# Patient Record
Sex: Female | Born: 2000 | Race: White | Hispanic: No | Marital: Single | Smoking: Never smoker
Health system: Southern US, Community
[De-identification: ages and names within clinical notes are randomized; demographics above are authoritative.]

## PROBLEM LIST (undated history)

## (undated) DIAGNOSIS — F250 Schizoaffective disorder, bipolar type: Secondary | ICD-10-CM

## (undated) DIAGNOSIS — F99 Mental disorder, not otherwise specified: Secondary | ICD-10-CM

## (undated) DIAGNOSIS — F70 Mild intellectual disabilities: Secondary | ICD-10-CM

## (undated) DIAGNOSIS — N920 Excessive and frequent menstruation with regular cycle: Secondary | ICD-10-CM

## (undated) DIAGNOSIS — Z789 Other specified health status: Secondary | ICD-10-CM

## (undated) HISTORY — DX: Mental disorder, not otherwise specified: F99

## (undated) HISTORY — PX: EYE EXAMINATION UNDER ANESTHESIA W/ RETINAL CRYOTHERAPY AND RETINAL LASER: SHX1561

## (undated) HISTORY — PX: ADENOIDECTOMY, TONSILLECTOMY AND MYRINGOTOMY WITH TUBE PLACEMENT: SHX5716

---
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
## 2020-04-16 DIAGNOSIS — G40909 Epilepsy, unspecified, not intractable, without status epilepticus: Secondary | ICD-10-CM

## 2020-04-16 DIAGNOSIS — R569 Unspecified convulsions: Secondary | ICD-10-CM

## 2020-04-16 HISTORY — DX: Epilepsy, unspecified, not intractable, without status epilepticus: G40.909

## 2020-04-16 HISTORY — DX: Unspecified convulsions: R56.9

## 2020-05-14 ENCOUNTER — Encounter (HOSPITAL_COMMUNITY): Payer: Self-pay | Admitting: Emergency Medicine

## 2020-05-14 ENCOUNTER — Emergency Department (HOSPITAL_COMMUNITY): Payer: Medicaid Other

## 2020-05-14 ENCOUNTER — Emergency Department (HOSPITAL_COMMUNITY)
Admission: EM | Admit: 2020-05-14 | Discharge: 2020-05-14 | Disposition: A | Discharge status: Home / Self Care | Payer: Medicaid Other | Attending: Emergency Medicine | Admitting: Emergency Medicine

## 2020-05-14 ENCOUNTER — Other Ambulatory Visit: Payer: Self-pay

## 2020-05-14 DIAGNOSIS — R569 Unspecified convulsions: Secondary | ICD-10-CM | POA: Insufficient documentation

## 2020-05-14 DIAGNOSIS — Z79899 Other long term (current) drug therapy: Secondary | ICD-10-CM | POA: Diagnosis not present

## 2020-05-14 DIAGNOSIS — R410 Disorientation, unspecified: Secondary | ICD-10-CM | POA: Insufficient documentation

## 2020-05-14 LAB — MAGNESIUM: Magnesium: 1.9 mg/dL (ref 1.7–2.4)

## 2020-05-14 LAB — BASIC METABOLIC PANEL
Anion gap: 11 (ref 5–15)
BUN: 17 mg/dL (ref 6–20)
CO2: 22 mmol/L (ref 22–32)
Calcium: 9 mg/dL (ref 8.9–10.3)
Chloride: 108 mmol/L (ref 98–111)
Creatinine, Ser: 0.9 mg/dL (ref 0.44–1.00)
GFR calc Af Amer: >60 mL/min (ref >60)
GFR calc non Af Amer: >60 mL/min (ref >60)
Glucose, Bld: 99 mg/dL (ref 70–99)
Potassium: 3.7 mmol/L (ref 3.5–5.1)
Sodium: 141 mmol/L (ref 135–145)

## 2020-05-14 LAB — CBC
HCT: 41.1 % (ref 36.0–46.0)
Hemoglobin: 13.8 g/dL (ref 12.0–15.0)
MCH: 29.2 pg (ref 26.0–34.0)
MCHC: 33.6 g/dL (ref 30.0–36.0)
MCV: 87.1 fL (ref 80.0–100.0)
Platelets: 217 10*3/uL (ref 150–400)
RBC: 4.72 MIL/uL (ref 3.87–5.11)
RDW: 12.7 % (ref 11.5–15.5)
WBC: 5.8 10*3/uL (ref 4.0–10.5)
nRBC: 0 % (ref 0.0–0.2)

## 2020-05-14 LAB — I-STAT BETA HCG BLOOD, ED (MC, WL, AP ONLY): I-stat hCG, quantitative: <5 m[IU]/mL (ref <5)

## 2020-05-14 MED ORDER — DIVALPROEX SODIUM 250 MG PO DR TAB
DELAYED_RELEASE_TABLET | ORAL | 0 refills | Status: DC
Start: 2020-05-14 — End: 2021-01-07

## 2020-05-14 MED ORDER — VALPROATE SODIUM 500 MG/5ML IV SOLN
750.0000 mg | Freq: Once | INTRAVENOUS | Status: AC
  Administered 2020-05-14: 750 mg via INTRAVENOUS
  Filled 2020-05-14: qty 7.5

## 2020-05-14 NOTE — ED Provider Notes (Signed)
[name] EMERGENCY DEPARTMENT Provider Note   CSN: [id number] Arrival date & time: 05/14/20  1139     History No chief complaint on file.   Ariel Clark is a 19 y.o. female.  Patient is a 19 year old female coming from home who has a past medical history of developmental delay, auditory hallucinations, schizoaffective disorder presenting to the emergency department with her father for witnessed seizure which occurred just prior to arrival.  She has no history of the same.  Father reports that she was searching for something in her room when all of a sudden she fell to her bottom and then to the fetal position.  Reports that her arm is turned inward and she began to have full body shaking, jaw clenching, urinary incontinence and foaming at the mouth.  Reports that this lasted for 1 to 2 minutes before coming to.  She was confused and combative when she came to.  She does not remember the event.  Father reports that he gives her her medication and she has not missed any doses.  She was taken off of prazosin the 21st of this month.  She receives a monthly injection for her hallucinations.  Denies any alcohol or drug use.  Denies any head trauma.  Of note, she did see a neurologist in 2016 related to her hallucinations and had a normal EEG at that time.  Patient is a poor historian due to her developmental delay.        Past Medical History:  Diagnosis Date  . Medical history non-contributory   . Premature birth    66 weeks    Patient Active Problem List   Diagnosis Date Noted  . MDD (major depressive disorder), recurrent, severe, with psychosis (HCC) 03/22/2018  . Auditory hallucinations 05/28/2015  . Visual hallucinations 05/28/2015  . Mild intellectual disability 05/28/2015    Past Surgical History:  Procedure Laterality Date  . EYE EXAMINATION UNDER ANESTHESIA W/ RETINAL CRYOTHERAPY AND RETINAL LASER       OB History   No obstetric history on file.      Family History  Problem Relation Age of Onset  . Cancer Maternal Grandmother 40  . Other Paternal Grandmother 49       Murdered    Social History   Tobacco Use  . Smoking status: Never Smoker  . Smokeless tobacco: Never Used  Vaping Use  . Vaping Use: Never used  Substance Use Topics  . Alcohol use: Never    Alcohol/week: 0.0 standard drinks  . Drug use: Never    Home Medications Prior to Admission medications   Medication Sig Start Date End Date Taking? Authorizing Provider  amantadine (SYMMETREL) 100 MG capsule Take 150 mg by mouth 2 (two) times daily.   Yes [provider]  QUEtiapine (SEROQUEL) 50 MG tablet Take 50 mg by mouth at bedtime.   Yes [provider]  divalproex (DEPAKOTE) 250 MG DR tablet Take 1 tablet (250 mg total) by mouth in the morning AND 2 tablets (500 mg total) at bedtime. 05/14/20 06/13/20  Arlyn Dunning, PA-C  FLUoxetine (PROZAC) 20 MG capsule Take 1 capsule (20 mg total) by mouth daily. 03/29/18   Leata Mouse, MD  prazosin (MINIPRESS) 1 MG capsule Take 1 capsule (1 mg total) by mouth at bedtime. 03/28/18   Leata Mouse, MD  ziprasidone (GEODON) 40 MG capsule Take 1 capsule (40 mg total) by mouth 2 (two) times daily with a meal. 03/28/18   Jonnalagadda,  Sharyne Peach, MD    Allergies    Patient has no known allergies.  Review of Systems   Review of Systems  Constitutional: Negative for fever.  HENT: Negative for rhinorrhea.   Eyes: Negative for visual disturbance.  Respiratory: Negative for shortness of breath.   Cardiovascular: Negative for chest pain.  Gastrointestinal: Negative for abdominal pain, nausea and vomiting.  Genitourinary: Negative for dysuria.  Musculoskeletal: Negative for arthralgias and neck pain.  Skin: Negative for rash and wound.  Neurological: Negative for dizziness.  Psychiatric/Behavioral: Negative for agitation. The patient is not nervous/anxious.   All other systems reviewed  and are negative.   Physical Exam Updated Vital Signs BP 130/90 (BP Location: Right Arm)   Pulse 83   Temp 97.6 F (36.4 C) (Oral)   Resp 18   Ht 4' (1.219 m)   Wt 47.7 kg   SpO2 98%   BMI 32.09 kg/m   Physical Exam Vitals and nursing note reviewed.  Constitutional:      Appearance: Normal appearance.  HENT:     Head: Normocephalic and atraumatic.     Nose: Nose normal.     Mouth/Throat:     Mouth: Mucous membranes are moist.  Eyes:     Conjunctiva/sclera: Conjunctivae normal.     Pupils: Pupils are equal, round, and reactive to light.  Pulmonary:     Effort: Pulmonary effort is normal.     Breath sounds: Normal breath sounds.  Abdominal:     General: Abdomen is flat.     Palpations: Abdomen is soft.  Skin:    General: Skin is dry.  Neurological:     General: No focal deficit present.     Mental Status: She is alert.     Sensory: No sensory deficit.     Gait: Gait normal.  Psychiatric:     Comments: reserved     ED Results / Procedures / Treatments   Labs (all labs ordered are listed, but only abnormal results are displayed) Labs Reviewed  BASIC METABOLIC PANEL  CBC  MAGNESIUM  I-STAT BETA HCG BLOOD, ED ([facility], [facility], [facility] ONLY)  CBG MONITORING, ED    EKG EKG Interpretation  Date/Time:  Tuesday May 14 2020 11:55:29 EDT Ventricular Rate:  93 PR Interval:  126 QRS Duration: 84 QT Interval:  332 QTC Calculation: 412 R Axis:   75 Text Interpretation: Normal sinus rhythm Nonspecific T wave abnormality Abnormal ECG No significant change since last tracing Confirmed by Richardean Canal [phone]) on 05/14/2020 6:31:20 PM   Radiology CT Head Wo Contrast  Result Date: 05/14/2020 CLINICAL DATA:  Seizure EXAM: CT HEAD WITHOUT CONTRAST TECHNIQUE: Contiguous axial images were obtained from the base of the skull through the vertex without intravenous contrast. COMPARISON:  None. FINDINGS: Brain: Negative for intracranial hemorrhage. Abnormal white matter hypodensity  bilaterally. The ventricles are nonenlarged. Vascular: No hyperdense vessels.  No unexpected calcification. Skull: Normal. Negative for fracture or focal lesion. Sinuses/Orbits: No acute finding. Other: None IMPRESSION: 1. Negative for intracranial hemorrhage. 2. Abnormal white matter hypodensity bilaterally/white matter disease, further evaluation with MRI is recommended. Electronically Signed   By: Jasmine Pang M.D.   On: 05/14/2020 19:35    Procedures Procedures (including critical care time)  Medications Ordered in ED Medications  valproate (DEPACON) 750 mg in dextrose 5 % 50 mL IVPB (750 mg Intravenous New Bag/Given 05/14/20 2151)    ED Course  I have reviewed the triage vital signs and the nursing notes.  Pertinent labs &  imaging results that were available during my care of the patient were reviewed by me and considered in my medical decision making (see chart for details).  Clinical Course as of May 14 2212  Tue May 14, 2020  28[birth year] 19 year old female with intellectual and psychological disabilities presents to the emergency department for apparent new onset seizure.  She is at her baseline.  Her father who is her power of attorney is at bedside who provides most of the history due to patient's intellectual disability.    [KM]  1955 I spoke with Dr. Wilford Corner from neurology regarding this patient. He reviewed the ct and lab results. He recommends Depakote 750 IV loading now now, then start 250 am and 500 nightly. F/u neuro and pmd this week., Discussed all results with patient and father at bedside   [KM]    Clinical Course User Index [KM] Jeral Pinch   MDM Rules/Calculators/A&P                          Based on review of vitals, medical screening exam, lab work and/or imaging, there does not appear to be an acute, emergent etiology for the patient's symptoms. Counseled pt on good return precautions and encouraged both PCP and ED follow-up as needed.  Prior to discharge, I  also discussed incidental imaging findings with patient in detail and advised appropriate, recommended follow-up in detail.  Clinical Impression: 1. Seizure [facility])     Disposition: Discharge  Prior to providing a prescription for a controlled substance, I independently reviewed the patient's recent prescription history on the [place] Controlled Substance Reporting System. The patient had no recent or regular prescriptions and was deemed appropriate for a brief, less than 3 day prescription of narcotic for acute analgesia.  This note was prepared with assistance of Conservation officer, historic buildings. Occasional wrong-word or sound-a-like substitutions may have occurred due to the inherent limitations of voice recognition software.  Final Clinical Impression(s) / ED Diagnoses Final diagnoses:  Seizure [facility])    Rx / DC Orders ED Discharge Orders         Ordered    divalproex (DEPAKOTE) 250 MG DR tablet     Discontinue  Reprint     05/14/20 2005           Jeral Pinch 05/14/20 2213    Charlynne Pander, MD 05/14/20 [phone]

## 2020-05-14 NOTE — ED Triage Notes (Signed)
Pt in after possible grand-mal seizure x 1 min, witnessed by dad, who is POA. No hx of seizures, and baseline a&ox2-3, nonverbal at times. Dad reports she had a psychotic/manic episode this am and fell on her bottom. Never had any head trauma, but had the seizure right after this. VSS as charted, back to baseline per EMS

## 2020-05-14 NOTE — ED Notes (Signed)
Taken to CT.

## 2020-05-14 NOTE — Discharge Instructions (Addendum)
You are seen today for what appears to be a new seizure.  Your lab work was normal.  Your CT scan showed some chronic white matter changes.  We discussed all of these findings with our neurologist Dr. Jerrell Belfast.  Per his recommendations.  We have given you an IV dose of Depakote and will start you on oral Depakote at home.  It is important that you call the neurology office tomorrow and be scheduled this week for a follow-up appointment and review have further testing such as an MRI and an EEG.  You also need to schedule follow-up with your primary medical doctor to check the Depakote levels in your blood.  In the meantime, please follow seizure precautions which include not doing anything that may be dangerous if he were to have another seizure.  This includes no swimming, no driving, no operating heavy machinery. Thank you for allowing me to care for you today. Please return to the emergency department if you have new or worsening symptoms. Take your medications as instructed.

## 2020-06-27 ENCOUNTER — Telehealth: Payer: Self-pay

## 2020-06-27 NOTE — Telephone Encounter (Signed)
Patient is scheduled to see Dr. Lucia Gaskins as a new patient on 07/10/20 for seizures. Patient was seen in the ED in June and they put her on Depakote 250mg  in the AM and 500mg  in the PM. Dr. at [facility] in [place] was calling to discuss this patient with Dr. [facility]. She checked her depakote level on 8/10 and it is 123.0. Patient has not had any seizure activity since June. She just isn't sure if she needed to be seen sooner or what Dr. 10/10 would recommend.  Dr. July can be reached at [phone]

## 2020-06-28 NOTE — Telephone Encounter (Signed)
I called back, Dr. Lucretia Roers is not in the office until Tuesday. If we have an earlier spot we can transfer her to this so I am adding the referral team on this message. I'm not too worried about a slightly elevated Depakote level if she is doing well, I don;t know if it was a trough level either so we will see her in the office. Also, if one of the seizure preferred providers(see information sheet from Dr. Marjory Lies) have an opening, she should  preferentially be seen by them thanks

## 2020-06-28 NOTE — Telephone Encounter (Signed)
I called back, Dr. Lucretia Roers is not in the office until Tuesday. If we have an earlier spot we can transfer her to this so I am adding the referral team on this message. I'm not too worried about a slightly elevated Depakote level if she is doing well, I don;t know if it was a trough level either so we will see her in the office thanks.

## 2020-07-05 ENCOUNTER — Ambulatory Visit: Payer: Medicaid Other | Admitting: Neurology

## 2020-07-05 ENCOUNTER — Encounter: Payer: Self-pay | Admitting: Neurology

## 2020-07-05 DIAGNOSIS — G40909 Epilepsy, unspecified, not intractable, without status epilepticus: Secondary | ICD-10-CM | POA: Diagnosis not present

## 2020-07-05 HISTORY — DX: Epilepsy, unspecified, not intractable, without status epilepticus: G40.909

## 2020-07-05 MED ORDER — ALPRAZOLAM 0.5 MG PO TABS: ORAL_TABLET | ORAL | 0 refills | Status: DC

## 2020-07-05 NOTE — Progress Notes (Signed)
Reason for visit: Seizure  Referring physician: Rainelle  Ariel Clark is a 19 y.o. female  History of present illness:  Ariel Clark is a 19 year old right-handed white female with a history of a premature birth at 35 weeks with a developmental delay associated with this.  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  As time has gone on, the patient has developed problems with hallucinations that began when she was in seventh grade, she was seen by Dr. Sharene Clark in the past for this.  An EEG study at that time was normal.  The patient apparently did have a CT scan of the brain when she was an infant, but there have been no brain studies since that time.  The results of the initial studies are not available to me.  The patient had a witnessed seizure event on 14 May 2020, and she went to the emergency room.  The patient had an episode of falling to the ground in the fetal position then having generalized jerking of the extremities, foaming at the mouth, and incontinence.  The patient took several minutes to start recovering her mental status.  The patient was brought to the hospital and underwent a CT scan of the brain that shows bifrontal low-density lesions and a low-density area in the right posterior frontal region.  There are no comparison studies.  The study was done without contrast.  The patient has returned to her usual baseline.  She was placed on Depakote around that time taking 250 mg in the morning and 500 mg in the evening, she has had a recent blood level of 123 as a random blood draw.  The patient seems to tolerate the medication fairly well.  About a year before she had a seizure, she has had some tremors of both hands that have continued on the Depakote.  She has not had any significant change in sensation or strength, she does report some numbness of the left hand.  She has some issues with gait instability, she did have a recent fall down some  stairs with minor injuries.  The patient denies problems controlling the bowels or the bladder, she will be undergoing a colonoscopy in the near future for blood in the stools.  The patient not had any recurring seizures on Depakote.  She comes to this office for further evaluation.  Past Medical History:  Diagnosis Date  . Medical history non-contributory   . Mental disorder   . Premature birth    26 weeks  . Seizures (HCC) 04/2020    Past Surgical History:  Procedure Laterality Date  . ADENOIDECTOMY, TONSILLECTOMY AND MYRINGOTOMY WITH TUBE PLACEMENT    . EYE EXAMINATION UNDER ANESTHESIA W/ RETINAL CRYOTHERAPY AND RETINAL LASER      Family History  Problem Relation Age of Onset  . Cancer Maternal Grandmother 103  . Other Paternal Grandmother 30       Murdered    Social history:  reports that she has never smoked. She has never used smokeless tobacco. She reports that she does not drink alcohol and does not use drugs.  Medications:  Prior to Admission medications   Medication Sig Start Date End Date Taking? Authorizing Provider  amantadine (SYMMETREL) 100 MG capsule Take 150 mg by mouth 2 (two) times daily.   Yes [provider]  ARISTADA 662 MG/2.4ML prefilled syringe SMARTSIG:1 IM Once a Month 06/26/20  Yes [provider]  divalproex (DEPAKOTE)  250 MG DR tablet Take 1 tablet (250 mg total) by mouth in the morning AND 2 tablets (500 mg total) at bedtime. 05/14/20 07/05/20 Yes Ronnie Doss A, PA-C  QUEtiapine (SEROQUEL) 50 MG tablet Take 50 mg by mouth at bedtime.   Yes [provider]  FLUoxetine (PROZAC) 20 MG capsule Take 1 capsule (20 mg total) by mouth daily. Patient not taking: Reported on 07/05/2020 03/29/18   Leata Mouse, MD  prazosin (MINIPRESS) 1 MG capsule Take 1 capsule (1 mg total) by mouth at bedtime. Patient not taking: Reported on 07/05/2020 03/28/18   Leata Mouse, MD     No Known Allergies  ROS:  Out of a  complete 14 system review of symptoms, the patient complains only of the following symptoms, and all other reviewed systems are negative.  Seizure Walking difficulty  Blood pressure 121/76, pulse (!) 101, height 5\' 1"  (1.549 m), weight 104 lb 8 oz (47.4 kg).  Physical Exam  General: The patient is alert and cooperative at the time of the examination.  Eyes: Pupils are equal, round, and reactive to light. Discs are flat bilaterally.  Neck: The neck is supple, no carotid bruits are noted.  Respiratory: The respiratory examination is clear.  Cardiovascular: The cardiovascular examination reveals a regular rate and rhythm, no obvious murmurs or rubs are noted.  Skin: Extremities are without significant edema.  Neurologic Exam  Mental status: The patient is alert and oriented x 3 at the time of the examination. The patient has apparent normal recent and remote memory, with an apparently normal attention span and concentration ability.  Cranial nerves: Facial symmetry is present. There is good sensation of the face to pinprick and soft touch bilaterally. The strength of the facial muscles and the muscles to head turning and shoulder shrug are normal bilaterally. Speech is well enunciated, no aphasia or dysarthria is noted. Extraocular movements are full. Visual fields are full. The tongue is midline, and the patient has symmetric elevation of the soft palate. No obvious hearing deficits are noted.  Motor: The motor testing reveals 5 over 5 strength of all 4 extremities. Good symmetric motor tone is noted throughout.  Sensory: Sensory testing is intact to pinprick, soft touch, vibration sensation, and position sense on all 4 extremities, with exception of some decreased pinprick and vibration sensation on the left hand. No evidence of extinction is noted.  Coordination: Cerebellar testing reveals good finger-nose-finger and heel-to-shin bilaterally.  Gait and station: Gait is slightly  wide-based, the patient can walk independently.  Tandem gait is minimally unsteady.  Romberg is negative.  Reflexes: Deep tendon reflexes are symmetric, but are somewhat depressed bilaterally. Toes are downgoing bilaterally.   CT head 05/14/20:  IMPRESSION: 1. Negative for intracranial hemorrhage. 2. Abnormal white matter hypodensity bilaterally/white matter disease, further evaluation with MRI is recommended.  * CT scan images were reviewed online. I agree with the written report.    Assessment/Plan:  1.  New onset seizure  2.  Abnormal CT scan brain  3.  Premature birth, 26 weeks  4.  Developmental delay  5.  Schizoaffective disorder, auditory hallucinations, bipolar disorder  The patient is being followed through psychiatry, they have prescribed the Depakote which was started in the emergency room.  The patient has an abnormal CT scan of the brain, MRI of the brain will be attempted with and without contrast.  The patient will have an EEG study.  She is okay from a neurologic standpoint to have colonoscopy.  She  will follow-up here in 6 months.  The patient does not operate a motor vehicle.  Marlan Palau MD 07/05/2020 10:35 AM  [facility] [address] [place], [place] [address]  Phone [phone] Fax [phone]

## 2020-07-08 ENCOUNTER — Telehealth: Payer: Self-pay | Admitting: Neurology

## 2020-07-08 NOTE — Telephone Encounter (Signed)
mcd ffs per [place] track order sent to GI. No auth they will reach out to the patient to schedule.

## 2020-07-10 ENCOUNTER — Ambulatory Visit: Payer: Self-pay | Admitting: Neurology

## 2020-07-14 ENCOUNTER — Ambulatory Visit
Admission: RE | Admit: 2020-07-14 | Discharge: 2020-07-14 | Disposition: A | Discharge status: Home / Self Care | Payer: Medicaid Other | Source: Ambulatory Visit | Attending: Neurology | Admitting: Neurology

## 2020-07-14 DIAGNOSIS — G40909 Epilepsy, unspecified, not intractable, without status epilepticus: Secondary | ICD-10-CM

## 2020-07-14 MED ORDER — GADOBENATE DIMEGLUMINE 529 MG/ML IV SOLN
9.0000 mL | Freq: Once | INTRAVENOUS | Status: AC | PRN
  Administered 2020-07-14: 9 mL via INTRAVENOUS

## 2020-07-15 ENCOUNTER — Telehealth: Payer: Self-pay | Admitting: Neurology

## 2020-07-15 NOTE — Telephone Encounter (Signed)
I called the father.  MRI of the brain shows white matter changes that likely related to hypoxic/ischemic changes around the time of birth.  This could be the source of seizures.  EEG is pending.    MRI brain 07/14/20:  IMPRESSION: This MRI of the brain with and without contrast shows the following: 1.   Extensive T2/FLAIR hyperintense foci in the hemispheres with confluencies in the deep white matter of the frontal and parietal lobes.  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 2.   There are no acute findings and there is a normal enhancement pattern.

## 2020-07-24 HISTORY — PX: COLONOSCOPY WITH ESOPHAGOGASTRODUODENOSCOPY (EGD): SHX5779

## 2020-07-29 ENCOUNTER — Other Ambulatory Visit: Payer: Self-pay

## 2020-07-29 ENCOUNTER — Ambulatory Visit: Payer: Medicaid Other | Admitting: Neurology

## 2020-07-29 ENCOUNTER — Telehealth: Payer: Self-pay | Admitting: Neurology

## 2020-07-29 DIAGNOSIS — G40909 Epilepsy, unspecified, not intractable, without status epilepticus: Secondary | ICD-10-CM

## 2020-07-29 NOTE — Telephone Encounter (Signed)
The EEG study was normal.  I called the father, discussed the results with him.

## 2020-07-29 NOTE — Procedures (Signed)
    History:  Ariel Clark is a 19 year old patient with a premature birth and developmental delay.  The patient is being evaluated for a witnessed seizure event that occurred on 14 May 2020.  The patient fell to the ground and then had a generalized jerking event, taken several minutes to recover her mental status after this episode.  CT shows evidence of bifrontal low-density lesions.  This is a routine EEG.  No skull defects are noted.  Medications include Symmetrel, Aristada, Depakote, Seroquel, and Minipress.  EEG classification: Normal awake  Description of the recording: The background rhythms of this recording consists of a fairly well modulated medium amplitude alpha rhythm of 11 Hz that is reactive to eye opening and closure. As the record progresses, the patient appears to remain in the waking state throughout the recording. Photic stimulation was performed, resulting in a bilateral and symmetric photic driving response. Hyperventilation was also performed, resulting in a minimal buildup of the background rhythm activities without significant slowing seen. At no time during the recording does there appear to be evidence of spike or spike wave discharges or evidence of focal slowing. EKG monitor shows no evidence of cardiac rhythm abnormalities with a heart rate of 72.  Impression: This is a normal EEG recording in the waking state. No evidence of ictal or interictal discharges are seen.

## 2020-11-16 HISTORY — PX: WISDOM TOOTH EXTRACTION: SHX21

## 2020-11-18 ENCOUNTER — Telehealth: Payer: Self-pay | Admitting: Neurology

## 2020-11-18 NOTE — Telephone Encounter (Signed)
Father asked pt be seen earlier as a result of seizure over the weekend, this is Ariel Clark

## 2020-11-19 NOTE — Progress Notes (Signed)
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  Had a seizure in June 2021.  MRI of the brain showed white matter changes likely related to hypoxic/ischemic changes around the time of birth.  EEG was normal.  She is on Depakote prescribed through psychiatry.  [place], lasted 3 minutes, she was incontinent of urine.  Was compliant with medications.  CT head showed no acute intercranial hemorrhage.  In the ER, labs showed WBC 12.8, normal liver function.  Since on Depakote, has noted tremor of both hands, hair thinning.  Mental health has been stable, also on amantadine 9, Abilify once a month injection, and Seroquel.  She has imaginary friends she talks with, are pleasant. Sees [facility]. Stays at home with her dad during the day.  Presents today for evaluation accompanied by her mother.  HISTORY 07/05/2020 Dr. Anne Hahn: Ariel Clark is a 20 year old right-handed white female with a history of a premature birth at 28 weeks with a developmental delay associated with this.  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  As time has gone on, the patient has developed problems with hallucinations that began when she was in seventh grade, she was seen by Dr. Sharene Skeans in the past for this.  An EEG study at that time was normal.  The patient apparently did have a CT scan of the brain when she was an infant, but there have been no brain studies since that time.  The results of the initial studies are not available to me.  The patient had a witnessed seizure event on 14 May 2020, and she went to the emergency room.  The patient had an episode of falling to the ground in the fetal position then having generalized  jerking of the extremities, foaming at the mouth, and incontinence.  The patient took several minutes to start recovering her mental status.  The patient was brought to the hospital and underwent a CT scan of the brain that shows bifrontal low-density lesions and a low-density area in the right posterior frontal region.  There are no comparison studies.  The study was done without contrast.  The patient has returned to her usual baseline.  She was placed on Depakote around that time taking 250 mg in the morning and 500 mg in the evening, she has had a recent blood level of 123 as a random blood draw.  The patient seems to tolerate the medication fairly well.  About a year before she had a seizure, she has had some tremors of both hands that have continued on the Depakote.  She has not had any significant change in sensation or strength, she does report some numbness of the left hand.  She has some issues with gait instability, she did have a recent fall down some stairs with minor injuries.  The patient denies problems controlling the bowels or the bladder, she will be undergoing a colonoscopy in the near future for blood in the stools.  The patient not had any recurring seizures on Depakote.  She comes to this office for further evaluation.  REVIEW OF SYSTEMS: Out of a complete 14 system review of symptoms, the patient complains only of the following symptoms, and all  other reviewed systems are negative.  Seizure  ALLERGIES: No Known Allergies  HOME MEDICATIONS: Outpatient Medications Prior to Visit  Medication Sig Dispense Refill  . ALPRAZolam (XANAX) 0.5 MG tablet Take 2 tablets approximately 45 minutes prior to the MRI study, take a third tablet if needed. 3 tablet 0  . amantadine (SYMMETREL) 100 MG capsule Take 150 mg by mouth 2 (two) times daily.    Ariel Clark 662 MG/2.4ML prefilled syringe SMARTSIG:1 IM Once a Month    . divalproex (DEPAKOTE) 250 MG DR tablet Take 1 tablet (250 mg total) by  mouth in the morning AND 2 tablets (500 mg total) at bedtime. 90 tablet 0  . QUEtiapine (SEROQUEL) 50 MG tablet Take 50 mg by mouth at bedtime.    Marland Kitchen FLUoxetine (PROZAC) 20 MG capsule Take 1 capsule (20 mg total) by mouth daily. (Patient not taking: Reported on 11/20/2020) 30 capsule 0  . prazosin (MINIPRESS) 1 MG capsule Take 1 capsule (1 mg total) by mouth at bedtime. (Patient not taking: Reported on 11/20/2020) 30 capsule 0   No facility-administered medications prior to visit.    PAST MEDICAL HISTORY: Past Medical History:  Diagnosis Date  . Medical history non-contributory   . Mental disorder   . Premature birth    26 weeks  . Seizure disorder (HCC) 07/05/2020  . Seizures (HCC) 04/2020    PAST SURGICAL HISTORY: Past Surgical History:  Procedure Laterality Date  . ADENOIDECTOMY, TONSILLECTOMY AND MYRINGOTOMY WITH TUBE PLACEMENT    . EYE EXAMINATION UNDER ANESTHESIA W/ RETINAL CRYOTHERAPY AND RETINAL LASER      FAMILY HISTORY: Family History  Problem Relation Age of Onset  . Cancer Maternal Grandmother 53  . Other Paternal Grandmother 80       Murdered    SOCIAL HISTORY: Social History   Socioeconomic History  . Marital status: Single    Spouse name: Not on file  . Number of children: 0  . Years of education: Not on file  . Highest education level: Not on file  Occupational History    Comment: disability  Tobacco Use  . Smoking status: Never Smoker  . Smokeless tobacco: Never Used  Vaping Use  . Vaping Use: Never used  Substance and Sexual Activity  . Alcohol use: Never    Alcohol/week: 0.0 standard drinks  . Drug use: Never  . Sexual activity: Never  Other Topics Concern  . Not on file  Social History Narrative   R handed   Lives with parents and sisters   1-2 cups of Caffeine daily     Social Determinants of Health   Financial Resource Strain: Not on file  Food Insecurity: Not on file  Transportation Needs: Not on file  Physical Activity: Not on file   Stress: Not on file  Social Connections: Not on file  Intimate Partner Violence: Not on file   PHYSICAL EXAM  Vitals:   11/20/20 0857  BP: 138/84  Pulse: (!) 102  Weight: 105 lb (47.6 kg)  Height: 5' (1.524 m)   Body mass index is 20.51 kg/m.  Generalized: Well developed, in no acute distress, developmental delay, mostly on her phone during visit Neurological examination  Mentation: Alert, most history is provided by her mother, follows exam commands, speech is clear Cranial nerve II-XII: Pupils were equal round reactive to light. Extraocular movements were full, visual field were full on confrontational test. Facial sensation and strength were normal. Head turning and shoulder shrug  were normal and symmetric. Motor:  The motor testing reveals 5 over 5 strength of all 4 extremities. Good symmetric motor tone is noted throughout.  Sensory: Sensory testing is intact to soft touch on all 4 extremities. No evidence of extinction is noted.  Coordination: Cerebellar testing reveals good finger-nose-finger and heel-to-shin bilaterally. No prominent tremor was seen Gait and station: Gait is normal. Tandem gait is normal. Romberg is negative. No drift is seen.  Reflexes: Deep tendon reflexes are symmetric but depressed throughout  DIAGNOSTIC DATA (LABS, IMAGING, TESTING) - I reviewed patient records, labs, notes, testing and imaging myself where available.  Lab Results  Component Value Date   WBC 5.8 05/14/2020   HGB 13.8 05/14/2020   HCT 41.1 05/14/2020   MCV 87.1 05/14/2020   PLT 217 05/14/2020      Component Value Date/Time   NA 141 05/14/2020 1206   K 3.7 05/14/2020 1206   CL 108 05/14/2020 1206   CO2 22 05/14/2020 1206   GLUCOSE 99 05/14/2020 1206   BUN 17 05/14/2020 1206   CREATININE 0.90 05/14/2020 1206   CALCIUM 9.0 05/14/2020 1206   PROT 7.7 03/24/2018 0630   ALBUMIN 4.5 03/24/2018 0630   AST 17 03/24/2018 0630   ALT 17 03/24/2018 0630   ALKPHOS 103 03/24/2018  0630   BILITOT 0.5 03/24/2018 0630   GFRNONAA >60 05/14/2020 1206   GFRAA >60 05/14/2020 1206   Lab Results  Component Value Date   CHOL 123 03/24/2018   HDL 50 03/24/2018   LDLCALC 62 03/24/2018   TRIG 53 03/24/2018   CHOLHDL 2.5 03/24/2018   Lab Results  Component Value Date   HGBA1C 4.9 03/24/2018   No results found for: VITAMINB12 Lab Results  Component Value Date   TSH 2.527 03/24/2018   ASSESSMENT AND PLAN 20 y.o. year old female  has a past medical history of Medical history non-contributory, Mental disorder, Premature birth, Seizure disorder ([place]) (07/05/2020), and Seizures ([place]) (04/2020). here with:  1.  New onset seizure (June 2021, 11/15/2020) 2.  Premature birth, 26 weeks 3.  Developmental delay 4.  Schizoaffective disorder, auditory hallucinations, bipolar disorder  -Breakthrough seizure on Depakote 250/500 mg daily, reported adverse effect of tremor, hair thinning, prescribed through psychiatry for mood disorder  -Will switch to Lamictal, wean off Depakote  Lamotrigine Titration Instructions with 25 mg tablets to achieve 100 mg twice daily  Start taking 1 tablet (25 mg) twice daily for 2 weeks Then take 2 tablets (50 mg) twice daily for 2 weeks  Then take 3 tablets (75 mg) twice daily for 2 weeks  Call our office upon completion, and I will send in a new prescription for 100 mg tablet twice daily  Once on Lamictal 100 mg twice daily for 1 week, will back off the Depakote by 250 mg every 1 week.  -MRI of the brain showed white matter changes likely related to hypoxic/ischemic changes around the time of birth -EEG was normal -Follow-up in 3 months or sooner if needed, call for seizure activity   I spent 30 minutes of face-to-face and non-face-to-face time with patient.  This included previsit chart review, lab review, study review, order entry, electronic health record documentation, patient education.  Butler Denmark, AGNP-C, DNP 11/20/2020, 9:31 AM [facility] [address] [place], [place] [address] [phone]

## 2020-11-20 ENCOUNTER — Encounter: Payer: Self-pay | Admitting: Neurology

## 2020-11-20 ENCOUNTER — Ambulatory Visit: Payer: Medicaid Other | Admitting: Neurology

## 2020-11-20 VITALS — BP 138/84 | HR 102 | Ht 60.0 in | Wt 105.0 lb

## 2020-11-20 DIAGNOSIS — R440 Auditory hallucinations: Secondary | ICD-10-CM

## 2020-11-20 DIAGNOSIS — G40909 Epilepsy, unspecified, not intractable, without status epilepticus: Secondary | ICD-10-CM | POA: Diagnosis not present

## 2020-11-20 MED ORDER — LAMOTRIGINE 25 MG PO TABS
ORAL_TABLET | ORAL | 0 refills | Status: DC
Start: 2020-11-20 — End: 2020-12-17

## 2020-11-20 NOTE — Progress Notes (Signed)
Lamictal RX 25mg  faxed to [facility]

## 2020-11-20 NOTE — Progress Notes (Signed)
I have read the note, and I agree with the clinical assessment and plan.  Loraine Freid K Kali Ambler   

## 2020-11-20 NOTE — Progress Notes (Addendum)
[facility] [phone], out to lunch 1-2p. Fax confirmation received , faxed notes to [facility]. [phone].

## 2020-11-20 NOTE — Patient Instructions (Signed)
Lamotrigine Titration Instructions with 25 mg tablets to achieve 100 mg twice daily  Start taking 1 tablet (25 mg) twice daily for 2 weeks Then take 2 tablets (50 mg) twice daily for 2 weeks  Then take 3 tablets (75 mg) twice daily for 2 weeks  Call our office upon completion, and I will send in a new prescription for 100 mg tablet twice daily  Once on Lamictal 100 mg twice daily for 1 week, will back off the Depakote by 250 mg every 1 week.  Lamotrigine tablets What is this medicine? LAMOTRIGINE (la MOE Patrecia Pace) is used to control seizures in adults and children with epilepsy and Lennox-Gastaut syndrome. It is also used in adults to treat bipolar disorder. This medicine may be used for other purposes; ask your health care provider or pharmacist if you have questions. COMMON BRAND NAME(S): Lamictal, Subvenite What should I tell my health care provider before I take this medicine? They need to know if you have any of these conditions:  aseptic meningitis during prior use of lamotrigine  depression  folate deficiency  kidney disease  liver disease  suicidal thoughts, plans, or attempt; a previous suicide attempt by you or a family member  an unusual or allergic reaction to lamotrigine or other seizure medications, other medicines, foods, dyes, or preservatives  pregnant or trying to get pregnant  breast-feeding How should I use this medicine? Take this medicine by mouth with a glass of water. Follow the directions on the prescription label. Do not chew these tablets. If this medicine upsets your stomach, take it with food or milk. Take your doses at regular intervals. Do not take your medicine more often than directed. A special MedGuide will be given to you by the pharmacist with each new prescription and refill. Be sure to read this information carefully each time. Talk to your pediatrician regarding the use of this medicine in children. While this drug may be prescribed for  children as young as 2 years for selected conditions, precautions do apply. Overdosage: If you think you have taken too much of this medicine contact a poison control center or emergency room at once. NOTE: This medicine is only for you. Do not share this medicine with others. What if I miss a dose? If you miss a dose, take it as soon as you can. If it is almost time for your next dose, take only that dose. Do not take double or extra doses. What may interact with this medicine?  atazanavir  carbamazepine  female hormones, including contraceptive or birth control pills  lopinavir  methotrexate  phenobarbital  phenytoin  primidone  pyrimethamine  rifampin  ritonavir  trimethoprim  valproic acid This list may not describe all possible interactions. Give your health care provider a list of all the medicines, herbs, non-prescription drugs, or dietary supplements you use. Also tell them if you smoke, drink alcohol, or use illegal drugs. Some items may interact with your medicine. What should I watch for while using this medicine? Visit your doctor or health care provider for regular checks on your progress. If you take this medicine for seizures, wear a Medic Alert bracelet or necklace. Carry an identification card with information about your condition, medicines, and doctor or health care provider. It is important to take this medicine exactly as directed. When first starting treatment, your dose will need to be adjusted slowly. It may take weeks or months before your dose is stable. You should contact your doctor or  health care provider if your seizures get worse or if you have any new types of seizures. Do not stop taking this medicine unless instructed by your doctor or health care provider. Stopping your medicine suddenly can increase your seizures or their severity. This medicine may cause serious skin reactions. They can happen weeks to months after starting the medicine.  Contact your health care provider right away if you notice fevers or flu-like symptoms with a rash. The rash may be red or purple and then turn into blisters or peeling of the skin. Or, you might notice a red rash with swelling of the face, lips or lymph nodes in your neck or under your arms. You may get drowsy, dizzy, or have blurred vision. Do not drive, use machinery, or do anything that needs mental alertness until you know how this medicine affects you. To reduce dizzy or fainting spells, do not sit or stand up quickly, especially if you are an older patient. Alcohol can increase drowsiness and dizziness. Avoid alcoholic drinks. If you are taking this medicine for bipolar disorder, it is important to report any changes in your mood to your doctor or health care provider. If your condition gets worse, you get mentally depressed, feel very hyperactive or manic, have difficulty sleeping, or have thoughts of hurting yourself or committing suicide, you need to get help from your health care provider right away. If you are a caregiver for someone taking this medicine for bipolar disorder, you should also report these behavioral changes right away. The use of this medicine may increase the chance of suicidal thoughts or actions. Pay special attention to how you are responding while on this medicine. Your mouth may get dry. Chewing sugarless gum or sucking hard candy, and drinking plenty of water may help. Contact your doctor if the problem does not go away or is severe. Women who become pregnant while using this medicine may enroll in the [place] Pregnancy Registry by calling [phone]. This registry collects information about the safety of antiepileptic drug use during pregnancy. This medicine may cause a decrease in folic acid. You should make sure that you get enough folic acid while you are taking this medicine. Discuss the foods you eat and the vitamins you take with your  health care provider. What side effects may I notice from receiving this medicine? Side effects that you should report to your doctor or health care professional as soon as possible:  allergic reactions like skin rash, itching or hives, swelling of the face, lips, or tongue  changes in vision  depressed mood  elevated mood, decreased need for sleep, racing thoughts, impulsive behavior  loss of balance or coordination  mouth sores  rash, fever, and swollen lymph nodes  redness, blistering, peeling or loosening of the skin, including inside the mouth  right upper belly pain  seizures  severe muscle pain  signs and symptoms of aseptic meningitis such as stiff neck and sensitivity to light, headache, drowsiness, fever, nausea, vomiting, rash  signs of infection - fever or chills, cough, sore throat, pain or difficulty passing urine  suicidal thoughts or other mood changes  swollen lymph nodes  trouble walking  unusual bruising or bleeding  unusually weak or tired  yellowing of the eyes or skin Side effects that usually do not require medical attention (report to your doctor or health care professional if they continue or are bothersome):  diarrhea  dizziness  dry mouth  stuffy nose  tiredness  tremors  trouble sleeping This list may not describe all possible side effects. Call your doctor for medical advice about side effects. You may report side effects to FDA at 1-800-FDA-1088. Where should I keep my medicine? Keep out of reach of children. Store at room temperature between 15 and 30 degrees C (59 and 86 degrees F). Throw away any unused medicine after the expiration date. NOTE: This sheet is a summary. It may not cover all possible information. If you have questions about this medicine, talk to your doctor, pharmacist, or health care provider.  2020 Elsevier/Gold Standard (2019-02-03 15:03:40)

## 2020-11-28 ENCOUNTER — Telehealth: Payer: Self-pay | Admitting: Neurology

## 2020-11-28 DIAGNOSIS — Z5181 Encounter for therapeutic drug level monitoring: Secondary | ICD-10-CM

## 2020-11-28 NOTE — Telephone Encounter (Signed)
Pt's father(on DPR) called to report pt just had a seizure, would like to discuss a rescue med

## 2020-11-28 NOTE — Telephone Encounter (Signed)
I called the patient, spoke with her father.  She had a tonic-clonic seizure today, lasted no more than 1 minute. She is recovering at home, improving.  Her father manages her medications, she is actually taking Depakote 250 mg twice a day, reported at last visit 250/500, managed by psychiatry.  Dose was reduced in early December due to tremors.   She is currently working up on Lamictal, we plan to wean off Depakote.  She has been on Lamictal 25 mg twice a day for 1 week, given recent seizure, will go ahead increase Lamictal 25/50 mg for 1 more week, then go to 50 mg BID.  Call for any further seizure activity.  We are slowly increasing the Lamictal, due to potential interaction with Depakote.

## 2020-11-29 NOTE — Telephone Encounter (Signed)
I called and talked with the father.  The patient had a brief seizure yesterday, today she feels somewhat wobbly on her feet and tingly in the legs.  The tremors are worse.  The patient is going on Lamictal, the dose was just increased last evening.  This will increase the levels of the Depakote.  The patient has had blood work done about a month ago, apparently her ferritin levels are elevated for some reason.   We will have him come in early next week and recheck blood work, look for pneumonia level, and look at the Depakote levels which may be changing on the Lamictal.

## 2020-11-29 NOTE — Addendum Note (Signed)
Addended by: York Spaniel on: 11/29/2020 12:25 PM   Modules accepted: Orders

## 2020-11-29 NOTE — Telephone Encounter (Signed)
Pt's father(on DPR) has called to report his concerns re: pt reporting that her legs are wobbly and shakey.  Father states pt informed him of a tingling feeling in his legs.  Father is aware the office is not open today, he is asking to be called when someone is available to do so.

## 2020-12-03 ENCOUNTER — Other Ambulatory Visit (INDEPENDENT_AMBULATORY_CARE_PROVIDER_SITE_OTHER): Payer: Self-pay

## 2020-12-03 DIAGNOSIS — Z0289 Encounter for other administrative examinations: Secondary | ICD-10-CM

## 2020-12-03 DIAGNOSIS — Z5181 Encounter for therapeutic drug level monitoring: Secondary | ICD-10-CM

## 2020-12-04 ENCOUNTER — Telehealth: Payer: Self-pay | Admitting: Neurology

## 2020-12-04 LAB — COMPREHENSIVE METABOLIC PANEL
ALT: 32 IU/L (ref 0–32)
AST: 30 IU/L (ref 0–40)
Albumin/Globulin Ratio: 1.5 (ref 1.2–2.2)
Albumin: 4.7 g/dL (ref 3.9–5.0)
Alkaline Phosphatase: 106 IU/L (ref 42–106)
BUN/Creatinine Ratio: 17 (ref 9–23)
BUN: 18 mg/dL (ref 6–20)
Bilirubin Total: 0.3 mg/dL (ref 0.0–1.2)
CO2: 23 mmol/L (ref 20–29)
Calcium: 10.7 mg/dL — ABNORMAL HIGH (ref 8.7–10.2)
Chloride: 104 mmol/L (ref 96–106)
Creatinine, Ser: 1.05 mg/dL — ABNORMAL HIGH (ref 0.57–1.00)
GFR calc Af Amer: 89 mL/min/{1.73_m2} (ref >59)
GFR calc non Af Amer: 77 mL/min/{1.73_m2} (ref >59)
Globulin, Total: 3.1 g/dL (ref 1.5–4.5)
Glucose: 90 mg/dL (ref 65–99)
Potassium: 5.1 mmol/L (ref 3.5–5.2)
Sodium: 149 mmol/L — ABNORMAL HIGH (ref 134–144)
Total Protein: 7.8 g/dL (ref 6.0–8.5)

## 2020-12-04 LAB — CBC WITH DIFFERENTIAL/PLATELET
Basophils Absolute: 0 10*3/uL (ref 0.0–0.2)
Basos: 0 %
EOS (ABSOLUTE): 0 10*3/uL (ref 0.0–0.4)
Eos: 1 %
Hematocrit: 41.4 % (ref 34.0–46.6)
Hemoglobin: 14.3 g/dL (ref 11.1–15.9)
Immature Grans (Abs): 0 10*3/uL (ref 0.0–0.1)
Immature Granulocytes: 0 %
Lymphocytes Absolute: 2.2 10*3/uL (ref 0.7–3.1)
Lymphs: 45 %
MCH: 31 pg (ref 26.6–33.0)
MCHC: 34.5 g/dL (ref 31.5–35.7)
MCV: 90 fL (ref 79–97)
Monocytes Absolute: 0.7 10*3/uL (ref 0.1–0.9)
Monocytes: 13 %
Neutrophils Absolute: 2 10*3/uL (ref 1.4–7.0)
Neutrophils: 41 %
Platelets: 171 10*3/uL (ref 150–450)
RBC: 4.61 x10E6/uL (ref 3.77–5.28)
RDW: 12.7 % (ref 11.7–15.4)
WBC: 4.9 10*3/uL (ref 3.4–10.8)

## 2020-12-04 LAB — LAMOTRIGINE LEVEL: Lamotrigine Lvl: 7 ug/mL (ref 2.0–20.0)

## 2020-12-04 LAB — VALPROIC ACID LEVEL: Valproic Acid Lvl: 129 ug/mL (ref 50–100)

## 2020-12-04 LAB — AMMONIA: Ammonia: 118 ug/dL (ref 29–112)

## 2020-12-04 NOTE — Telephone Encounter (Signed)
I called the father. The patient has taken Depakote 250 mg twice daily, she will drop down to 1 a day for 2 weeks and then stop the drug. The Depakote level was elevated at 129 with a slightly elevated ammonia level. The lamotrigine is already in the therapeutic range at 7 as there is an interaction between the 2 medications.  We will continue to go up on the dose of the Lamictal as she comes down off of the Depakote.

## 2020-12-14 ENCOUNTER — Other Ambulatory Visit: Payer: Self-pay | Admitting: Neurology

## 2020-12-23 ENCOUNTER — Encounter: Payer: Self-pay | Admitting: Neurology

## 2020-12-23 DIAGNOSIS — G40909 Epilepsy, unspecified, not intractable, without status epilepticus: Secondary | ICD-10-CM

## 2021-01-07 ENCOUNTER — Telehealth: Payer: Self-pay | Admitting: Neurology

## 2021-01-07 DIAGNOSIS — G40909 Epilepsy, unspecified, not intractable, without status epilepticus: Secondary | ICD-10-CM

## 2021-01-07 MED ORDER — LAMOTRIGINE 100 MG PO TABS
ORAL_TABLET | ORAL | 11 refills | Status: DC
Start: 2021-01-07 — End: 2021-01-16

## 2021-01-07 NOTE — Telephone Encounter (Signed)
Please call the patient, in response to my chart message. I am going to order CT head, reports difficulty walking with recent seizures, want to make sure not an acute process going on. Currently on Lamictal 100 mg twice daily. Go ahead and increase the dose up to 100 mg am/150 mg pm. In 2 weeks will increase up to 150 mg BID, let us know how she is doing. Let's get CT head as soon as possible. Most recent seizure was Sunday night. I sent in the Lamictal 100's, took Depakote off her medication list since she is finished with it. In 2 weeks,

## 2021-01-07 NOTE — Telephone Encounter (Signed)
Sent CM to [facility] re: this, awaiting word.

## 2021-01-07 NOTE — Telephone Encounter (Signed)
Awaiting for fax, have not received as yet.

## 2021-01-07 NOTE — Telephone Encounter (Signed)
Medicaid order sent to GI. They will reach out to the patient to schedule.

## 2021-01-07 NOTE — Telephone Encounter (Signed)
I called father the patient.  Explained the plan of getting a CT scan today at [facility] imaging.  Has been ordered [facility] will call to set it up, per Irving Burton in MRI authorization department.  Instructed the father on her new Lamictal dosing where she will up on her dose to 100 mg in the morning and 150 mg in the evening and after 2 weeks she will increase that to 150 mg twice a day.  He asked about handicap placard also orders for shower stool and wheelchair for patient.

## 2021-01-08 ENCOUNTER — Ambulatory Visit: Payer: Medicaid Other | Admitting: Neurology

## 2021-01-08 NOTE — Telephone Encounter (Signed)
Received notes.  Reviewed by SS/NP and order for standard wheelchair and shower bench signed.  Handicap placard application signed as well.

## 2021-01-15 ENCOUNTER — Other Ambulatory Visit: Payer: Self-pay

## 2021-01-15 ENCOUNTER — Ambulatory Visit
Admission: RE | Admit: 2021-01-15 | Discharge: 2021-01-15 | Disposition: A | Discharge status: Home / Self Care | Payer: Medicaid Other | Source: Ambulatory Visit | Attending: Neurology | Admitting: Neurology

## 2021-01-15 DIAGNOSIS — G40909 Epilepsy, unspecified, not intractable, without status epilepticus: Secondary | ICD-10-CM

## 2021-01-15 NOTE — Telephone Encounter (Signed)
Victorino Dike from [employer] states that they have been requesting office notes for pt. & if they are not received by 01/17/21, the order will be declined.   Best contact: [phone]

## 2021-01-15 NOTE — Telephone Encounter (Signed)
The information that [facility] is requesting is information that the physical therapy evaluation would have been able to complete. Unfortunately I will have access to the physical therapy notes.  I have contacted the home health area at [phone].  I was able to speak to a person who states they have sent [facility] of the information that they have.  They have confirmation of fax where it is gone..  I have informed them that they are contacting us requesting this information and we don't have it to give.  Advised them to send me what they have to my fax number, provided my number.  She states that she has notes from 01/03/21, and that she will send me that in if she has any other information she also met along with that as well.

## 2021-01-15 NOTE — Telephone Encounter (Signed)
Called the patient and advised that they should have access to our notes and she states they have the notes but are in need of the wheelchair narrative. They have resent this today to our main fax number. I have requested that they forward that to my fax number attn to me and I will look into completing this.

## 2021-01-16 ENCOUNTER — Telehealth: Payer: Self-pay | Admitting: Neurology

## 2021-01-16 MED ORDER — LAMOTRIGINE 100 MG PO TABS
150.0000 mg | ORAL_TABLET | Freq: Two times a day (BID) | ORAL | 11 refills | Status: DC
Start: 2021-01-16 — End: 2021-02-20

## 2021-01-16 NOTE — Telephone Encounter (Signed)
I finally received notes from the home health agency including the PT evaluation.  I have forwarded that information along with our office notes to [facility].  I have also signed the information and sent back to the home health agency for their records as well.  Received confirmation that both faxes went through.

## 2021-01-16 NOTE — Telephone Encounter (Signed)
I called the patient CT head was stable, no acute findings. Currently taking Lamictal 100/150 mg daily, on this dose for 1 week 1 day, Last seizure was 2 days ago. Will go ahead and increase the Lamictal to 150 mg twice daily. I talked with her father, he called her psychiatrist, she lowered some of her medications, has appointment 3/17 with psych. Her dad will let me know of any more seizure activity.   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 -No acute findings.

## 2021-01-20 NOTE — Telephone Encounter (Signed)
I called and spoke to adrienne from [facility].   She stated per medicaid that they require addendum that pt is not able to walk 25 ft or more without mobility assistance or is totally immobile.  Please advise.

## 2021-01-20 NOTE — Telephone Encounter (Signed)
Called [facility] [phone]  Janean Sark returning her call about information needed.

## 2021-01-20 NOTE — Telephone Encounter (Signed)
I called [facility] and [facility] to speak to [place] [facility] who did the evaluation to ask him questions relating to [facility].  He works only Fri thru Monday.  Hopefully I will hear from him tomorrow.

## 2021-01-20 NOTE — Telephone Encounter (Signed)
Paperwork provided to RN for her records

## 2021-01-21 ENCOUNTER — Telehealth: Payer: Self-pay | Admitting: Neurology

## 2021-01-21 NOTE — Telephone Encounter (Signed)
[facility] Victorino Dike) called, order for wheelchair and shower bench has been declined. Because we have not received office notes to process the order. Contact info: [phone]

## 2021-01-21 NOTE — Telephone Encounter (Signed)
See previous note.  Have spoken to [place].

## 2021-01-21 NOTE — Telephone Encounter (Signed)
Spoke to [place], she stated that the per Medicaid that if pt cannot walk unaided over 25 ft or is immobile she would qualify.  I relayed that per SS/NP that last seen she is not able to state this, as she was walking unaided (although supervised by father) when in.  Roland with PT called.  He relayed that he did make more specific clarifications about pt/safely, verbal cues for pt,family assistance.  I relayed to Adrienne at [facility] and she will move forward with shower bench and wheelchair will deal with when other information is sent in.  I spoke to father initially prior to [place] and Hansel Starling that per medicaid that she did not qualify per there specific paramaters and he verbalized understanding.

## 2021-01-22 ENCOUNTER — Other Ambulatory Visit: Payer: Medicaid Other

## 2021-02-17 NOTE — Progress Notes (Signed)
PATIENT: Ariel Clark DOB: 2001-04-23  REASON FOR VISIT: follow up HISTORY FROM: patient  HISTORY OF PRESENT ILLNESS: Today 02/18/21 Ariel Clark is a 20 year old female with history of seizures.  When she first came to our office in 2021, was on Depakote from psychiatry, had recurrent seizures.  We ultimately switched her off Depakote to Lamictal.  Back in February, reported difficulty walking following seizures.  CT head showed no acute process. Had seizure on Sunday April 3rd, on Lamictal 150 mg twice daily. This is first in a few weeks. That day she went outside, got so nervous to go down steps, she had seizure. Saw her psychologist yesterday, concerned for conversion disorder, pseudoseizures. So anxious now about having seizures, won't get up on her own. Today is walking holding her dad's hand. More bad than good days, can walk but is anxious about it. PT provided shower chair, using her dad's old walker. January 2021 had mental breakdown, in psych unit, seizure came in June 2021. Claims before seizures her legs feel tingling, then knows she falls down. Started on Buspar recently, are increasing the dose, still a lot of anxiety about everything. This last seizure, she urinated, loss of consciousness, full body shaking, lasting 1-2 minutes, 10-20 minutes before she is verbal again. This has really changed her family's life. Since seizures, gets anxious in large crowds, anything outside the norm at her house. Lamictal is making her in happy mood. Has schizoaffective/bipolar disorder.  Here today accompanied by her dad.  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  Had a seizure in June 2021.  MRI of the brain showed white matter changes likely related to hypoxic/ischemic changes around the time of birth.  EEG was normal.  She is on Depakote prescribed through psychiatry.  [place] some steps, lasted 3 minutes, she was incontinent of urine.  Was compliant with medications.  CT head showed no acute intercranial hemorrhage.  In the ER, labs showed WBC 12.8, normal liver function.  Since on Depakote, has noted tremor of both hands, hair thinning.  Mental health has been stable, also on amantadine 9, Abilify once a month injection, and Seroquel.  She has imaginary friends she talks with, are pleasant. Sees [facility]. Stays at home with her dad during the day.  Presents today for evaluation accompanied by her mother.  HISTORY 07/05/2020 Dr. Anne HahnWillis: Ariel Clark is a 20 year old right-handed white female with a history of a premature birth at 4026 weeks with a developmental delay associated with this.  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  As time has gone on, the patient has developed problems with hallucinations that began when she was in seventh grade, she was seen by Dr. Sharene SkeansHickling in the past for this.  An EEG study at that time was normal.  The patient apparently did have a CT scan of the brain when she was an infant, but there have been no brain studies since that time.  The results of the initial studies are not available to me.  The patient had a witnessed seizure event on 14 May 2020, and she went to the emergency room.  The patient had an episode of falling to the ground in the fetal position then having generalized jerking of the extremities, foaming at the mouth, and incontinence.  The patient  took several minutes to start recovering her mental status.  The patient was brought to the hospital and underwent a CT scan of the brain that shows bifrontal low-density lesions and a low-density area in the right posterior frontal region.  There are no comparison studies.  The study was done without contrast.  The patient has returned to her usual baseline.  She was placed on Depakote around that time taking 250 mg in the  morning and 500 mg in the evening, she has had a recent blood level of 123 as a random blood draw.  The patient seems to tolerate the medication fairly well.  About a year before she had a seizure, she has had some tremors of both hands that have continued on the Depakote.  She has not had any significant change in sensation or strength, she does report some numbness of the left hand.  She has some issues with gait instability, she did have a recent fall down some stairs with minor injuries.  The patient denies problems controlling the bowels or the bladder, she will be undergoing a colonoscopy in the near future for blood in the stools.  The patient not had any recurring seizures on Depakote.  She comes to this office for further evaluation.  REVIEW OF SYSTEMS: Out of a complete 14 system review of symptoms, the patient complains only of the following symptoms, and all other reviewed systems are negative.  Seizure  ALLERGIES: No Known Allergies  HOME MEDICATIONS: Outpatient Medications Prior to Visit  Medication Sig Dispense Refill  . ALPRAZolam (XANAX) 0.5 MG tablet Take 2 tablets approximately 45 minutes prior to the MRI study, take a third tablet if needed. 3 tablet 0  . Amantadine HCl 100 MG tablet Take 100 mg by mouth 2 (two) times daily.    [name] 662 MG/2.4ML prefilled syringe SMARTSIG:1 IM Once a Month    . busPIRone (BUSPAR) 5 MG tablet PLEASE SEE ATTACHED FOR DETAILED DIRECTIONS    . FLUoxetine (PROZAC) 20 MG capsule Take 1 capsule (20 mg total) by mouth daily. 30 capsule 0  . lamoTRIgine (LAMICTAL) 100 MG tablet Take 1.5 tablets (150 mg total) by mouth 2 (two) times daily. 90 tablet 11  . prazosin (MINIPRESS) 1 MG capsule Take 1 capsule (1 mg total) by mouth at bedtime. 30 capsule 0  . QUEtiapine (SEROQUEL) 50 MG tablet Take 50 mg by mouth at bedtime.    Marland Kitchen amantadine (SYMMETREL) 100 MG capsule Take 150 mg by mouth 2 (two) times daily.     No facility-administered medications  prior to visit.    PAST MEDICAL HISTORY: Past Medical History:  Diagnosis Date  . Medical history non-contributory   . Mental disorder   . Premature birth    26 weeks  . Seizure disorder (HCC) 07/05/2020  . Seizures (HCC) 04/2020    PAST SURGICAL HISTORY: Past Surgical History:  Procedure Laterality Date  . ADENOIDECTOMY, TONSILLECTOMY AND MYRINGOTOMY WITH TUBE PLACEMENT    . EYE EXAMINATION UNDER ANESTHESIA W/ RETINAL CRYOTHERAPY AND RETINAL LASER      FAMILY HISTORY: Family History  Problem Relation Age of Onset  . Cancer Maternal Grandmother 57  . Other Paternal Grandmother 78       Murdered    SOCIAL HISTORY: Social History   Socioeconomic History  . Marital status: Single    Spouse name: Not on file  . Number of children: 0  . Years of education: Not on file  . Highest education level: Not  on file  Occupational History    Comment: disability  Tobacco Use  . Smoking status: Never Smoker  . Smokeless tobacco: Never Used  Vaping Use  . Vaping Use: Never used  Substance and Sexual Activity  . Alcohol use: Never    Alcohol/week: 0.0 standard drinks  . Drug use: Never  . Sexual activity: Never  Other Topics Concern  . Not on file  Social History Narrative   R handed   Lives with parents and sisters   1-2 cups of Caffeine daily     Social Determinants of Health   Financial Resource Strain: Not on file  Food Insecurity: Not on file  Transportation Needs: Not on file  Physical Activity: Not on file  Stress: Not on file  Social Connections: Not on file  Intimate Partner Violence: Not on file   PHYSICAL EXAM  Vitals:   02/18/21 0908  BP: 132/85  Pulse: 97  Weight: 99 lb (44.9 kg)  Height: 5' (1.524 m)   Body mass index is 19.33 kg/m.  Generalized: Well developed, in no acute distress, disheveled Neurological examination  Mentation: Alert, most history is provided by her father, follows exam commands, speech is clear, fidgety Cranial nerve  II-XII: Pupils were equal round reactive to light. Extraocular movements were full, visual field were full on confrontational test. Facial sensation and strength were normal. Head turning and shoulder shrug  were normal and symmetric. Motor: The motor testing reveals 5 over 5 strength of all 4 extremities. Good symmetric motor tone is noted throughout.  Sensory: Sensory testing is intact to soft touch on all 4 extremities. No evidence of extinction is noted.  Coordination: Cerebellar testing reveals good finger-nose-finger and heel-to-shin bilaterally.  No tremor. Gait and station: Can stand on her own, can walk on her own steadily and quick pace, but holds her dads hand, able to get on exam table, but sitting on the table exaggerated full body tremors; sat on rolling walker to roll to lab, can walk in flip flops Reflexes: Deep tendon reflexes are symmetric but depressed throughout  DIAGNOSTIC DATA (LABS, IMAGING, TESTING) - I reviewed patient records, labs, notes, testing and imaging myself where available.  Lab Results  Component Value Date   WBC 4.9 12/03/2020   HGB 14.3 12/03/2020   HCT 41.4 12/03/2020   MCV 90 12/03/2020   PLT 171 12/03/2020      Component Value Date/Time   NA 149 (H) 12/03/2020 1319   K 5.1 12/03/2020 1319   CL 104 12/03/2020 1319   CO2 23 12/03/2020 1319   GLUCOSE 90 12/03/2020 1319   GLUCOSE 99 05/14/2020 1206   BUN 18 12/03/2020 1319   CREATININE 1.05 (H) 12/03/2020 1319   CALCIUM 10.7 (H) 12/03/2020 1319   PROT 7.8 12/03/2020 1319   ALBUMIN 4.7 12/03/2020 1319   AST 30 12/03/2020 1319   ALT 32 12/03/2020 1319   ALKPHOS 106 12/03/2020 1319   BILITOT 0.3 12/03/2020 1319   GFRNONAA 77 12/03/2020 1319   GFRAA 89 12/03/2020 1319   Lab Results  Component Value Date   CHOL 123 03/24/2018   HDL 50 03/24/2018   LDLCALC 62 03/24/2018   TRIG 53 03/24/2018   CHOLHDL 2.5 03/24/2018   Lab Results  Component Value Date   HGBA1C 4.9 03/24/2018   No results  found for: [id number] Lab Results  Component Value Date   TSH 2.527 03/24/2018   ASSESSMENT AND PLAN 20 y.o. year old female  has a past medical  history of Medical history non-contributory, Mental disorder, Premature birth, Seizure disorder (HCC) (07/05/2020), and Seizures (HCC) (04/2020). here with:  1.  New onset seizure 2.  Premature birth, 26 weeks 3.  Developmental delay 4.  Schizoaffective disorder, auditory hallucinations, bipolar disorder  -Most recent seizure February 16, 2021 (became anxious about having to go outside, witnessed generalized seizure with urinary incontinence, postictal about 20 minutes afterwards), on Lamictal 150 mg twice daily  -Check routine labs today, will plan to increase Lamictal if level allows  -Check EEG, was normal in September 2021  -No physical reason for reported gait difficulties, has underlying mental illness, anxiety, I feel is contributing, psychiatrist has suggested conversion disorder, possible pseudoseizures, but her seizures do have real qualities (urinary incontinence, postictal periods)  -CT head performed in March 2022, following reported gait abnormalities, was overall stable  -May consider EMU evaluation if needed, seizures began in 2021, following a significant mental breakdown with psychiatric admission  -MRI of the brain showed white matter changes likely related to hypoxic/ischemic changes around the time of birth (Aug 2021)  -Follow-up in 3-4 months or sooner if needed, call for seizure activity   I spent 30 minutes of face-to-face and non-face-to-face time with patient.  This included previsit chart review, lab review, study review, order entry, electronic health record documentation, patient education.  Margie Ege, AGNP-C, DNP 02/18/2021, 10:29 AM [facility] [address], [address] [place], [place] [address] [phone]

## 2021-02-18 ENCOUNTER — Other Ambulatory Visit: Payer: Self-pay

## 2021-02-18 ENCOUNTER — Ambulatory Visit: Payer: Medicaid Other | Admitting: Neurology

## 2021-02-18 ENCOUNTER — Encounter: Payer: Self-pay | Admitting: Neurology

## 2021-02-18 VITALS — BP 132/85 | HR 97 | Ht 60.0 in | Wt 99.0 lb

## 2021-02-18 DIAGNOSIS — G40909 Epilepsy, unspecified, not intractable, without status epilepticus: Secondary | ICD-10-CM | POA: Diagnosis not present

## 2021-02-18 DIAGNOSIS — F99 Mental disorder, not otherwise specified: Secondary | ICD-10-CM | POA: Insufficient documentation

## 2021-02-18 NOTE — Patient Instructions (Signed)
Check labs today  Increase Lamictal if level allows  Check EEG  Let me know of any seizure  See you back in 3-4 months

## 2021-02-19 LAB — COMPREHENSIVE METABOLIC PANEL
ALT: 20 IU/L (ref 0–32)
AST: 16 IU/L (ref 0–40)
Albumin/Globulin Ratio: 1.9 (ref 1.2–2.2)
Albumin: 4.7 g/dL (ref 3.9–5.0)
Alkaline Phosphatase: 127 IU/L — ABNORMAL HIGH (ref 42–106)
BUN/Creatinine Ratio: 13 (ref 9–23)
BUN: 11 mg/dL (ref 6–20)
Bilirubin Total: 0.3 mg/dL (ref 0.0–1.2)
CO2: 23 mmol/L (ref 20–29)
Calcium: 9.8 mg/dL (ref 8.7–10.2)
Chloride: 104 mmol/L (ref 96–106)
Creatinine, Ser: 0.88 mg/dL (ref 0.57–1.00)
Globulin, Total: 2.5 g/dL (ref 1.5–4.5)
Glucose: 74 mg/dL (ref 65–99)
Potassium: 4.7 mmol/L (ref 3.5–5.2)
Sodium: 143 mmol/L (ref 134–144)
Total Protein: 7.2 g/dL (ref 6.0–8.5)
eGFR: 97 mL/min/{1.73_m2} (ref >59)

## 2021-02-19 LAB — CBC WITH DIFFERENTIAL/PLATELET
Basophils Absolute: 0.1 10*3/uL (ref 0.0–0.2)
Basos: 1 %
EOS (ABSOLUTE): 0.1 10*3/uL (ref 0.0–0.4)
Eos: 1 %
Hematocrit: 42.7 % (ref 34.0–46.6)
Hemoglobin: 13.7 g/dL (ref 11.1–15.9)
Immature Grans (Abs): 0 10*3/uL (ref 0.0–0.1)
Immature Granulocytes: 0 %
Lymphocytes Absolute: 2.1 10*3/uL (ref 0.7–3.1)
Lymphs: 39 %
MCH: 28.5 pg (ref 26.6–33.0)
MCHC: 32.1 g/dL (ref 31.5–35.7)
MCV: 89 fL (ref 79–97)
Monocytes Absolute: 0.4 10*3/uL (ref 0.1–0.9)
Monocytes: 7 %
Neutrophils Absolute: 2.8 10*3/uL (ref 1.4–7.0)
Neutrophils: 52 %
Platelets: 272 10*3/uL (ref 150–450)
RBC: 4.81 x10E6/uL (ref 3.77–5.28)
RDW: 12 % (ref 11.7–15.4)
WBC: 5.4 10*3/uL (ref 3.4–10.8)

## 2021-02-19 LAB — LAMOTRIGINE LEVEL: Lamotrigine Lvl: 8.8 ug/mL (ref 2.0–20.0)

## 2021-02-19 NOTE — Progress Notes (Signed)
I have read the note, and I agree with the clinical assessment and plan.  Jerrilyn Messinger K Brittiney Dicostanzo   

## 2021-02-20 ENCOUNTER — Telehealth: Payer: Self-pay | Admitting: Neurology

## 2021-02-20 MED ORDER — LAMOTRIGINE 100 MG PO TABS: ORAL_TABLET | ORAL | 11 refills | Status: DC

## 2021-02-20 NOTE — Telephone Encounter (Signed)
Called father of pt.  Relayed the lab results per SS/NP alk phos 127 wll monitor as midley elevated.  lamictal 8.8.  Since seizure this weekend will increase to lamotrigine 154m po am 2071mpo pm.  He verbalized understanding.  Will let usKoreanow if she has any other seizures as we have room to increase if needed.

## 2021-02-20 NOTE — Telephone Encounter (Signed)
Please call the patient's father, blood work is unremarkable with the exception of a mildly elevated alkaline phosphatase 127, will follow over time.  Lamictal level is 8.8.  We have room to increase.  She is currently taking Lamictal 150 mg twice daily.  She had a seizure event over the weekend.  Let us go ahead and increase the medication 150 mg AM/200 mg PM.  Have her father let me know if she continues to have seizures, we will continue to increase the dose.

## 2021-02-27 ENCOUNTER — Encounter: Payer: Self-pay | Admitting: Neurology

## 2021-02-27 DIAGNOSIS — Z5181 Encounter for therapeutic drug level monitoring: Secondary | ICD-10-CM

## 2021-02-27 DIAGNOSIS — G40909 Epilepsy, unspecified, not intractable, without status epilepticus: Secondary | ICD-10-CM

## 2021-03-11 ENCOUNTER — Telehealth: Payer: Self-pay | Admitting: Neurology

## 2021-03-11 ENCOUNTER — Encounter: Payer: Self-pay | Admitting: Neurology

## 2021-03-11 DIAGNOSIS — G40909 Epilepsy, unspecified, not intractable, without status epilepticus: Secondary | ICD-10-CM

## 2021-03-11 MED ORDER — LAMOTRIGINE 100 MG PO TABS: 200.0000 mg | ORAL_TABLET | Freq: Two times a day (BID) | ORAL | 11 refills | Status: DC

## 2021-03-11 NOTE — Telephone Encounter (Signed)
Called father of pt.  No trigger that aware of per father.  (happened at night when asleep, youngest daughter sleeps in same room and notifed parents. (tensing, foam at mouth, incontinence, shaking all over ) time approx 2-3 min, slept afterwards, ok this am.  Taking lamotrigine 150mg  po am, 200mg  pm. EEG scheduled this Thursday.

## 2021-03-11 NOTE — Addendum Note (Signed)
Addended by: Guy Begin on: 03/11/2021 02:39 PM   Modules accepted: Orders

## 2021-03-11 NOTE — Telephone Encounter (Signed)
Her father sent a my chart message reporting Ariel Clark had a seizure last night, should be taking Lamictal 150 mg AM/200 mg PM.  Trying to clarify the seizure event, what was observed before, during, after. No triggers?  We can increase the medication to 200 mg twice daily.  I had ordered EEG, please ensure this is scheduled. Any further changes in the dosing, I would want to check a level again, last was 8.8, when on 150 mg BID. If she continues to have seizure, I may refer to EMU, especially if EEG is normal.

## 2021-03-11 NOTE — Telephone Encounter (Signed)
Spoke to pts father. Relayed per SS/NP to increase lamotrigine to 200mg  po bid , prescriptions updated at pharmacy.  Will get lamotrigine level prior to EEG on Thursday.  Father verbalized understanding.

## 2021-03-11 NOTE — Addendum Note (Signed)
Addended by: Guy Begin on: 03/11/2021 01:17 PM   Modules accepted: Orders

## 2021-03-13 ENCOUNTER — Other Ambulatory Visit: Payer: Self-pay

## 2021-03-13 ENCOUNTER — Ambulatory Visit: Payer: Medicaid Other

## 2021-03-13 ENCOUNTER — Other Ambulatory Visit (INDEPENDENT_AMBULATORY_CARE_PROVIDER_SITE_OTHER): Payer: Self-pay

## 2021-03-13 DIAGNOSIS — Z5181 Encounter for therapeutic drug level monitoring: Secondary | ICD-10-CM

## 2021-03-13 DIAGNOSIS — G40909 Epilepsy, unspecified, not intractable, without status epilepticus: Secondary | ICD-10-CM | POA: Diagnosis not present

## 2021-03-13 DIAGNOSIS — Z0289 Encounter for other administrative examinations: Secondary | ICD-10-CM

## 2021-03-14 LAB — T4, FREE: Free T4: 1.49 ng/dL (ref 0.93–1.60)

## 2021-03-14 LAB — LAMOTRIGINE LEVEL: Lamotrigine Lvl: 11.7 ug/mL (ref 2.0–20.0)

## 2021-03-14 LAB — T3: T3, Total: 106 ng/dL (ref 71–180)

## 2021-03-14 LAB — TSH: TSH: 2.02 u[IU]/mL (ref 0.450–4.500)

## 2021-03-17 ENCOUNTER — Telehealth: Payer: Self-pay

## 2021-03-17 NOTE — Telephone Encounter (Signed)
-----   Message from Glean Salvo, NP sent at 03/17/2021  5:57 AM EDT ----- Sent my chart message:  [name],  Blood work is normal.  Lamictal is still within range.  Can continue at 200 mg twice daily. Maralyn Sago

## 2021-03-22 ENCOUNTER — Encounter: Payer: Self-pay | Admitting: Neurology

## 2021-03-24 ENCOUNTER — Telehealth: Payer: Self-pay | Admitting: Neurology

## 2021-03-24 DIAGNOSIS — G40909 Epilepsy, unspecified, not intractable, without status epilepticus: Secondary | ICD-10-CM

## 2021-03-24 NOTE — Procedures (Signed)
    History:  Ariel Clark is a 20 year old patient with a history of seizures and an underlying anxiety disorder.  The patient is being followed through psychiatry, there are concerns for possible pseudoseizures.  The patient is having more frequent seizure-like events.  She is being evaluated for this issue.  This is a routine EEG.  No skull defects are noted.  Medications include alprazolam, amantadine, Aristada, BuSpar, Prozac, Lamictal, prazosin, and Seroquel.  EEG classification: Normal awake  Description of the recording: The background rhythms of this recording consists of a fairly well modulated medium amplitude alpha rhythm of 10 Hz that is reactive to eye opening and closure. As the record progresses, the patient appears to remain in the waking state throughout the recording. Photic stimulation was performed, resulting in a bilateral and symmetric photic driving response. Hyperventilation was also performed, resulting in a minimal buildup of the background rhythm activities without significant slowing seen. At no time during the recording does there appear to be evidence of spike or spike wave discharges or evidence of focal slowing. EKG monitor shows no evidence of cardiac rhythm abnormalities with a heart rate of 96.  Impression: This is a normal EEG recording in the waking state. No evidence of ictal or interictal discharges are seen.

## 2021-03-24 NOTE — Addendum Note (Signed)
Addended by: Glean Salvo on: 03/24/2021 01:48 PM   Modules accepted: Orders

## 2021-03-24 NOTE — Addendum Note (Signed)
Addended by: Guy Begin on: 03/24/2021 01:38 PM   Modules accepted: Orders

## 2021-03-24 NOTE — Telephone Encounter (Signed)
Dr. Anne Hahn, reviewed Ariel Clark's recent EEG, it was normal. Currently on Lamictal 200 mg twice daily, still having spells brought on anxiety, Lamictal level was 11.7. I am going to refer to EMU with Dr. Melynda Ripple. Not clear these are true seizure events.

## 2021-03-25 NOTE — Telephone Encounter (Signed)
Referral sent to Dr. Melynda Ripple via Epic. Spoke with Marylene Land. Phone: [phone].

## 2021-04-11 ENCOUNTER — Other Ambulatory Visit (HOSPITAL_COMMUNITY): Payer: Medicaid Other

## 2021-04-13 ENCOUNTER — Encounter: Payer: Self-pay | Admitting: Neurology

## 2021-05-02 ENCOUNTER — Other Ambulatory Visit (HOSPITAL_COMMUNITY)
Admission: RE | Admit: 2021-05-02 | Discharge: 2021-05-02 | Disposition: A | Discharge status: Home / Self Care | Payer: Medicaid Other | Source: Ambulatory Visit | Attending: Neurology | Admitting: Neurology

## 2021-05-02 DIAGNOSIS — Z20822 Contact with and (suspected) exposure to covid-19: Secondary | ICD-10-CM | POA: Insufficient documentation

## 2021-05-02 DIAGNOSIS — Z01812 Encounter for preprocedural laboratory examination: Secondary | ICD-10-CM | POA: Insufficient documentation

## 2021-05-02 LAB — SARS CORONAVIRUS 2 (TAT 6-24 HRS): SARS Coronavirus 2: NEGATIVE

## 2021-05-05 ENCOUNTER — Other Ambulatory Visit: Payer: Self-pay

## 2021-05-05 ENCOUNTER — Inpatient Hospital Stay (HOSPITAL_COMMUNITY): Payer: Medicaid Other

## 2021-05-05 ENCOUNTER — Inpatient Hospital Stay (HOSPITAL_COMMUNITY)
Admission: RE | Admit: 2021-05-05 | Discharge: 2021-05-09 | DRG: 101 | Disposition: A | Discharge status: Home / Self Care | Payer: Medicaid Other | Source: Ambulatory Visit | Attending: Neurology | Admitting: Neurology

## 2021-05-05 DIAGNOSIS — Z79899 Other long term (current) drug therapy: Secondary | ICD-10-CM

## 2021-05-05 DIAGNOSIS — K5900 Constipation, unspecified: Secondary | ICD-10-CM | POA: Diagnosis present

## 2021-05-05 DIAGNOSIS — G931 Anoxic brain damage, not elsewhere classified: Secondary | ICD-10-CM | POA: Diagnosis present

## 2021-05-05 DIAGNOSIS — R32 Unspecified urinary incontinence: Secondary | ICD-10-CM | POA: Diagnosis present

## 2021-05-05 DIAGNOSIS — R569 Unspecified convulsions: Secondary | ICD-10-CM | POA: Diagnosis present

## 2021-05-05 DIAGNOSIS — F250 Schizoaffective disorder, bipolar type: Secondary | ICD-10-CM | POA: Diagnosis present

## 2021-05-05 LAB — CBC WITH DIFFERENTIAL/PLATELET
Abs Immature Granulocytes: 0.02 10*3/uL (ref 0.00–0.07)
Basophils Absolute: 0 10*3/uL (ref 0.0–0.1)
Basophils Relative: 1 %
Eosinophils Absolute: 0.1 10*3/uL (ref 0.0–0.5)
Eosinophils Relative: 2 %
HCT: 41.2 % (ref 36.0–46.0)
Hemoglobin: 14.2 g/dL (ref 12.0–15.0)
Immature Granulocytes: 0 %
Lymphocytes Relative: 36 %
Lymphs Abs: 2.2 10*3/uL (ref 0.7–4.0)
MCH: 29.6 pg (ref 26.0–34.0)
MCHC: 34.5 g/dL (ref 30.0–36.0)
MCV: 86 fL (ref 80.0–100.0)
Monocytes Absolute: 0.6 10*3/uL (ref 0.1–1.0)
Monocytes Relative: 10 %
Neutro Abs: 3.1 10*3/uL (ref 1.7–7.7)
Neutrophils Relative %: 51 %
Platelets: 231 10*3/uL (ref 150–400)
RBC: 4.79 MIL/uL (ref 3.87–5.11)
RDW: 12.8 % (ref 11.5–15.5)
WBC: 6 10*3/uL (ref 4.0–10.5)
nRBC: 0 % (ref 0.0–0.2)

## 2021-05-05 LAB — COMPREHENSIVE METABOLIC PANEL
ALT: 26 U/L (ref 0–44)
AST: 20 U/L (ref 15–41)
Albumin: 4 g/dL (ref 3.5–5.0)
Alkaline Phosphatase: 105 U/L (ref 38–126)
Anion gap: 7 (ref 5–15)
BUN: 10 mg/dL (ref 6–20)
CO2: 26 mmol/L (ref 22–32)
Calcium: 9 mg/dL (ref 8.9–10.3)
Chloride: 105 mmol/L (ref 98–111)
Creatinine, Ser: 0.9 mg/dL (ref 0.44–1.00)
GFR, Estimated: >60 mL/min (ref >60)
Glucose, Bld: 93 mg/dL (ref 70–99)
Potassium: 3.4 mmol/L — ABNORMAL LOW (ref 3.5–5.1)
Sodium: 138 mmol/L (ref 135–145)
Total Bilirubin: 0.8 mg/dL (ref 0.3–1.2)
Total Protein: 6.8 g/dL (ref 6.5–8.1)

## 2021-05-05 LAB — PROTIME-INR
INR: 1.1 (ref 0.8–1.2)
Prothrombin Time: 13.9 seconds (ref 11.4–15.2)

## 2021-05-05 LAB — PHOSPHORUS: Phosphorus: 3.1 mg/dL (ref 2.5–4.6)

## 2021-05-05 LAB — HIV ANTIBODY (ROUTINE TESTING W REFLEX): HIV Screen 4th Generation wRfx: NONREACTIVE

## 2021-05-05 LAB — VALPROIC ACID LEVEL: Valproic Acid Lvl: <10 ug/mL — ABNORMAL LOW (ref 50.0–100.0)

## 2021-05-05 LAB — GLUCOSE, CAPILLARY: Glucose-Capillary: 121 mg/dL — ABNORMAL HIGH (ref 70–99)

## 2021-05-05 LAB — MAGNESIUM: Magnesium: 2.1 mg/dL (ref 1.7–2.4)

## 2021-05-05 MED ORDER — LAMOTRIGINE 100 MG PO TABS
100.0000 mg | ORAL_TABLET | Freq: Every day | ORAL | Status: AC
  Administered 2021-05-05: 100 mg via ORAL
  Filled 2021-05-05: qty 1

## 2021-05-05 MED ORDER — MELATONIN 5 MG PO TABS
10.0000 mg | ORAL_TABLET | Freq: Every evening | ORAL | Status: DC | PRN
  Administered 2021-05-05 – 2021-05-08 (×4): 10 mg via ORAL
  Filled 2021-05-05 (×6): qty 2

## 2021-05-05 MED ORDER — LORAZEPAM 2 MG/ML IJ SOLN: 2.0000 mg | Freq: Four times a day (QID) | INTRAMUSCULAR | Status: DC | PRN

## 2021-05-05 MED ORDER — DOCUSATE SODIUM 100 MG PO CAPS
100.0000 mg | ORAL_CAPSULE | Freq: Every day | ORAL | Status: DC
  Administered 2021-05-06 – 2021-05-09 (×4): 100 mg via ORAL
  Filled 2021-05-05 (×4): qty 1

## 2021-05-05 MED ORDER — LABETALOL HCL 5 MG/ML IV SOLN: 5.0000 mg | INTRAVENOUS | Status: DC | PRN

## 2021-05-05 MED ORDER — ACETAMINOPHEN 650 MG RE SUPP: 650.0000 mg | RECTAL | Status: DC | PRN

## 2021-05-05 MED ORDER — AMANTADINE HCL 100 MG PO CAPS
100.0000 mg | ORAL_CAPSULE | Freq: Two times a day (BID) | ORAL | Status: DC
  Administered 2021-05-05 – 2021-05-09 (×8): 100 mg via ORAL
  Filled 2021-05-05 (×8): qty 1

## 2021-05-05 MED ORDER — QUETIAPINE FUMARATE 100 MG PO TABS
100.0000 mg | ORAL_TABLET | Freq: Every day | ORAL | Status: DC
  Administered 2021-05-05 – 2021-05-08 (×4): 100 mg via ORAL
  Filled 2021-05-05 (×4): qty 1

## 2021-05-05 MED ORDER — ACETAMINOPHEN 325 MG PO TABS: 650.0000 mg | ORAL_TABLET | ORAL | Status: DC | PRN

## 2021-05-05 MED ORDER — BUSPIRONE HCL 10 MG PO TABS
10.0000 mg | ORAL_TABLET | Freq: Three times a day (TID) | ORAL | Status: DC
  Administered 2021-05-05 – 2021-05-09 (×12): 10 mg via ORAL
  Filled 2021-05-05 (×12): qty 1

## 2021-05-05 MED ORDER — ENOXAPARIN SODIUM 40 MG/0.4ML IJ SOSY
40.0000 mg | PREFILLED_SYRINGE | INTRAMUSCULAR | Status: DC
  Administered 2021-05-06 – 2021-05-09 (×4): 40 mg via SUBCUTANEOUS
  Filled 2021-05-05 (×6): qty 0.4

## 2021-05-05 NOTE — Progress Notes (Signed)
LTM EMU EEG hooked up and running - no initial skin breakdown - push button tested - neuro notified. [facility] monitoring.

## 2021-05-05 NOTE — H&P (Signed)
CC: seizure  History is obtained from: Patient's father bedside, chart review  HPI: Ariel Clark is a 20 y.o. 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  Patient's father states patient was born 3 months premature and was in NICU.  She had developmental delay but was relatively independent and "smartest kid" in her class.  However, in January 2021 suddenly one day she had significant change in behavior, had psychotic symptoms and has not been in the same since, requiring significantly more help at home with basic ADLs, worsening visual hallucinations, trouble walking and difficulty in class.  About 4 months ago she started having seizure-like episodes described as stiffening of whole body, foaming at mouth and urinary incontinence lasting for couple of minutes.  She has fallen down multiple times during these episodes but has not had any significant injury or loss of consciousness per father.  He reports that these episodes were happening couple of times a week until she was started on lamotrigine.  Since then episodes are happening every 2 to 3 weeks.  These episodes also happen at night when she is asleep and has fallen during one such episodes. Per father, 2 to 3 days preceding the event patient has significantly more trouble walking, no other clear warning sign preceding the event.  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  Last seizure was on May 29.  Prior AEDs: Was on Depakote but had significant tremors.  Seizure risk factors: History of preterm birth with NICU stay, developmental delay, denies febrile seizures, one cousin had febrile seizures, denies meningitis/encephalitis, denies head injury with loss of consciousness  ROS: All  other systems reviewed and negative except as noted in the HPI.   Past Medical History:  Diagnosis Date   Medical history non-contributory    Mental disorder    Premature birth    104 weeks   Seizure disorder (HCC) 07/05/2020   Seizures (HCC) 04/2020    Family History  Problem Relation Age of Onset   Cancer Maternal Grandmother 81   Other Paternal Grandmother 88       Murdered    Social History:  reports that she has never smoked. She has never used smokeless tobacco. She reports that she does not drink alcohol and does not use drugs.  Medications Prior to Admission  Medication Sig Dispense Refill Last Dose   amantadine (SYMMETREL) 100 MG capsule Take by mouth 2 (two) times daily.   05/05/2021   ARISTADA 662 MG/2.4ML prefilled syringe Inject 662 mg into the muscle once.   05/03/2021   busPIRone (BUSPAR) 5 MG tablet Take 10 mg by mouth 3 (three) times daily.   05/05/2021   docusate sodium (COLACE) 100 MG capsule Take 100 mg by mouth daily. 1 tab in the morning.   05/05/2021   lamoTRIgine (LAMICTAL) 100 MG tablet Take 2 tablets (200 mg total) by mouth 2 (two) times daily. Take 1.5 tablets in the morning, take 2 tablets in the evening (Patient taking differently: Take 200 mg by mouth 2 (two) times daily.) 120 tablet 11 05/05/2021   Melatonin 10 MG TABS Take 10 mg by mouth at bedtime as needed (Sleep). 1 tab at bedtime   05/04/2021   QUEtiapine (SEROQUEL) 50 MG tablet Take 100 mg by mouth at bedtime.  05/04/2021   ALPRAZolam (XANAX) 0.5 MG tablet Take 2 tablets approximately 45 minutes prior to the MRI study, take a third tablet if needed. (Patient not taking: No sig reported) 3 tablet 0 Not Taking   FLUoxetine (PROZAC) 20 MG capsule Take 1 capsule (20 mg total) by mouth daily. (Patient not taking: Reported on 05/05/2021) 30 capsule 0 Not Taking   prazosin (MINIPRESS) 1 MG capsule Take 1 capsule (1 mg total) by mouth at bedtime. (Patient not taking: Reported on 05/05/2021) 30 capsule 0 Not Taking       Exam: Current vital signs: BP (!) 136/93 (BP Location: Right Arm)   Pulse 93   Temp 98 F (36.7 C) (Oral)   Resp 18   Ht 5' (1.524 m)   Wt 51.3 kg   LMP 05/05/2021 (Approximate) Comment: spotting.  SpO2 100%   BMI 22.11 kg/m  Vital signs in last 24 hours: Temp:  [98 F (36.7 C)] 98 F (36.7 C) (06/20 0800) Pulse Rate:  [93] 93 (06/20 0800) Resp:  [18] 18 (06/20 0800) BP: (136)/(93) 136/93 (06/20 0800) SpO2:  [100 %] 100 % (06/20 0800) Weight:  [51.3 kg] 51.3 kg (06/20 0800)   Physical Exam  Constitutional: Appears well-developed and well-nourished.  Psych: Affect appropriate to situation Eyes: No scleral injection HENT: No OP obstrucion Head: Normocephalic.  Cardiovascular: Normal rate and regular rhythm.  Respiratory: Effort normal, non-labored breathing GI: Soft.  No distension. There is no tenderness.  Skin: Warm Neuro: Awake, alert, oriented to time and place, able to follow simple one-step commands, cranial nerves II 12 grossly intact, 5/5 in upper extremities, FTN grossly intact   I have reviewed labs in epic and the results pertinent to this consultation are: CBC:  Recent Labs  Lab 05/05/21 0905  WBC 6.0  NEUTROABS 3.1  HGB 14.2  HCT 41.2  MCV 86.0  PLT 231    Basic Metabolic Panel:  Lab Results  Component Value Date   NA 138 05/05/2021   K 3.4 (L) 05/05/2021   CO2 26 05/05/2021   GLUCOSE 93 05/05/2021   BUN 10 05/05/2021   CREATININE 0.90 05/05/2021   CALCIUM 9.0 05/05/2021   GFRNONAA >60 05/05/2021   GFRAA 89 12/03/2020   Lipid Panel:  Lab Results  Component Value Date   LDLCALC 62 03/24/2018   HgbA1c:  Lab Results  Component Value Date   HGBA1C 4.9 03/24/2018   Urine Drug Screen: No results found for: LABOPIA, COCAINSCRNUR, LABBENZ, AMPHETMU, THCU, LABBARB  Alcohol Level No results found for: ETH   I have reviewed the images obtained:  CT head without contrast 01/15/2021: Stable subcortical hypodensities and  ventriculomegaly, likely related to history of prematurity and hypoxic ischemic encephalopathy. No acute findings.  MRI brain with and without contrast 07/14/2020: Extensive T2/FLAIR hyperintense foci in the hemispheres with confluencies in the deep white matter of the frontal and parietal lobes.  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 There are no acute findings and there is a normal enhancement pattern.  ASSESSMENT/PLAN: 20 year old female with history of hypoxic/anoxic brain injury at time of birth was admitted for seizure-like episodes.  Convulsions -We will start video EEG monitoring for characterization of episodes -On lamotrigine 200 mg twice daily, will reduce to 100 mg tonight and stop tomorrow morning for seizure provocation -Continue seizure precautions -as needed IV Ativan for clinical seizure-like episodes  Schizoaffective with bipolar disorder -Continue home medications  Constipation -continue as needed docusate   Ariel Clark  Hortense Ramal [facility]

## 2021-05-06 LAB — LAMOTRIGINE LEVEL: Lamotrigine Lvl: 10.3 ug/mL (ref 2.0–20.0)

## 2021-05-06 NOTE — Plan of Care (Signed)

## 2021-05-06 NOTE — Progress Notes (Signed)
EEG maintenance complete. No skin breakdown at FP1 Fp2 F3. Continue to monitor
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
## 2021-05-06 NOTE — Progress Notes (Addendum)
Subjective: No acute events overnight.  No clinical seizures.  ROS: negative except above  Examination  Vital signs in last 24 hours: Temp:  [98.1 F (36.7 C)-99 F (37.2 C)] 98.4 F (36.9 C) (06/21 1116) Pulse Rate:  [91-108] 91 (06/21 1116) Resp:  [16-20] 18 (06/21 1116) BP: (121-133)/(75-96) 121/81 (06/21 1116) SpO2:  [95 %-100 %] 95 % (06/21 1116)  General: lying in bed, not in apparent distress CVS: pulse-normal rate and rhythm RS: breathing comfortably, CTAB Extremities: normal,  warm Neuro: Awake, alert, oriented to time and place, able to follow simple one-step commands, cranial nerves II 12 grossly intact, 5/5 in upper extremities, FTN grossly intact  Basic Metabolic Panel: Recent Labs  Lab 05/05/21 0905  NA 138  K 3.4*  CL 105  CO2 26  GLUCOSE 93  BUN 10  CREATININE 0.90  CALCIUM 9.0  MG 2.1  PHOS 3.1    CBC: Recent Labs  Lab 05/05/21 0905  WBC 6.0  NEUTROABS 3.1  HGB 14.2  HCT 41.2  MCV 86.0  PLT 231     Coagulation Studies: Recent Labs    05/05/21 1040  LABPROT 13.9  INR 1.1    Imaging No new brain imaging overnight  ASSESSMENT AND PLAN: 20 year old female with history of hypoxic/anoxic brain injury at time of birth was admitted for seizure-like episodes.   Convulsions -Continue video EEG monitoring for characterization of episodes -Holding lamotrigine, HV and photic stimulation today for seizure provocation -Continue seizure precautions -as needed IV Ativan for clinical seizure-like episodes   Schizoaffective with bipolar disorder -Continue home medications   Constipation -continue as needed docusate   I have spent a total of 25  minutes with the patient reviewing hospital notes,  test results, labs and examining the patient as well as establishing an assessment and plan. Patient's father( POA) was called and plan was discussed, he agrees with the plan.  > 50% of time was spent in direct patient care.     Lindie Spruce [facility] For questions after 5pm please refer to AMION to reach the Neurologist on call

## 2021-05-06 NOTE — Procedures (Signed)
Patient Name: Ariel Clark  MRN: [id number]  Epilepsy Attending: Charlsie Quest  Referring Physician/Provider: Dr Lindie Spruce Duration: 05/05/2021 4098 to 08/06/2021 0946  Patient history: 20 year old female with history of hypoxic/anoxic brain injury at time of birth was admitted for seizure-like episodes.  EEG for characterization of spells.   Level of alertness: Awake, asleep  AEDs during EEG study: Lamotrigine  Technical aspects: This EEG study was done with scalp electrodes positioned according to the 10-20 International system of electrode placement. Electrical activity was acquired at a sampling rate of 500Hz  and reviewed with a high frequency filter of 70Hz  and a low frequency filter of 1Hz . EEG data were recorded continuously and digitally stored.   Description: The posterior dominant rhythm consists of 10 Hz activity of moderate voltage (25-35 uV) seen predominantly in posterior head regions, symmetric and reactive to eye opening and eye closing. Sleep was characterized by vertex waves, sleep spindles (12 to 14 Hz), maximal frontocentral region. Hyperventilation and photic stimulation were not performed.     IMPRESSION: This study is within normal limits. No seizures or epileptiform discharges were seen throughout the recording.  Solana Coggin 

## 2021-05-07 ENCOUNTER — Telehealth: Payer: Self-pay | Admitting: Emergency Medicine

## 2021-05-07 NOTE — Procedures (Addendum)
Patient Name: Ariel Clark  MRN: [id number]  Epilepsy Attending: Charlsie Quest  Referring Physician/Provider: Dr Lindie Spruce Duration: 05/06/2021 0093 to 08/07/2021 0946   Patient history: 20 year old female with history of hypoxic/anoxic brain injury at time of birth was admitted for seizure-like episodes.  EEG for characterization of spells.   Level of alertness: Awake, asleep   AEDs during EEG study: None   Technical aspects: This EEG study was done with scalp electrodes positioned according to the 10-20 International system of electrode placement. Electrical activity was acquired at a sampling rate of 500Hz  and reviewed with a high frequency filter of 70Hz  and a low frequency filter of 1Hz . EEG data were recorded continuously and digitally stored.   Description: The posterior dominant rhythm consists of 10 Hz activity of moderate voltage (25-35 uV) seen predominantly in posterior head regions, symmetric and reactive to eye opening and eye closing. Sleep was characterized by vertex waves, sleep spindles (12 to 14 Hz), maximal frontocentral region.  Physiologic photic driving was not seen during photic stimulation.  No EEG change was seen during hyperventilation.  Parts of study were difficult to interpret due to significant movement artifact  between 1800 to 2300.    IMPRESSION: This study is within normal limits. No seizures or epileptiform discharges were seen throughout the recording.   Burdell Peed 

## 2021-05-07 NOTE — Progress Notes (Signed)
Subjective: No acute events overnight. No seizures overnight.  ROS: negative except above  Examination  Vital signs in last 24 hours: Temp:  [97.5 F (36.4 C)-98.6 F (37 C)] 97.7 F (36.5 C) (06/22 1108) Pulse Rate:  [81-91] 87 (06/22 1108) Resp:  [14-20] 20 (06/22 1108) BP: (117-128)/(79-91) 123/79 (06/22 1108) SpO2:  [97 %-98 %] 98 % (06/22 1108)  General: lying in bed, not in apparent distress CVS: pulse-normal rate and rhythm RS: breathing comfortably, CTAB Extremities: normal,  warm Neuro: Awake, alert, oriented to time and place, able to follow simple one-step commands, cranial nerves II 12 grossly intact, 5/5 in upper extremities, FTN grossly intact  Basic Metabolic Panel: Recent Labs  Lab 05/05/21 0905  NA 138  K 3.4*  CL 105  CO2 26  GLUCOSE 93  BUN 10  CREATININE 0.90  CALCIUM 9.0  MG 2.1  PHOS 3.1    CBC: Recent Labs  Lab 05/05/21 0905  WBC 6.0  NEUTROABS 3.1  HGB 14.2  HCT 41.2  MCV 86.0  PLT 231     Coagulation Studies: Recent Labs    05/05/21 1040  LABPROT 13.9  INR 1.1    Imaging No new brain imaging overnight   ASSESSMENT AND PLAN: 20 year old female with history of hypoxic/anoxic brain injury at time of birth was admitted for seizure-like episodes.   Convulsions -Continue video EEG monitoring for characterization of episodes -Continue to hold lamotrigine for seizure provocation -Continue seizure precautions -as needed IV Ativan for clinical seizure-like episodes   Schizoaffective with bipolar disorder -Continue home medications   Constipation -continue as needed docusate   I have spent a total of 25  minutes with the patient reviewing hospital notes,  test results, labs and examining the patient as well as establishing an assessment and plan.  > 50% of time was spent in direct patient care.   Lindie Spruce [facility]ilepsy [facility] For questions after 5pm please refer to AMION to reach the Neurologist on  call

## 2021-05-07 NOTE — Plan of Care (Signed)

## 2021-05-07 NOTE — Telephone Encounter (Signed)
Surgical Release signed and faxed to Ocie Doyne, DMD  OK transmission received.

## 2021-05-07 NOTE — Progress Notes (Signed)
LTM maintenance completed; no skin breakdown was seen; also performed HV and photic.

## 2021-05-08 MED ORDER — LAMOTRIGINE 100 MG PO TABS
200.0000 mg | ORAL_TABLET | Freq: Two times a day (BID) | ORAL | Status: DC
  Administered 2021-05-09: 200 mg via ORAL
  Filled 2021-05-08: qty 2

## 2021-05-08 MED ORDER — LAMOTRIGINE 100 MG PO TABS
100.0000 mg | ORAL_TABLET | Freq: Every day | ORAL | Status: AC
  Administered 2021-05-08: 100 mg via ORAL
  Filled 2021-05-08: qty 1

## 2021-05-08 NOTE — Procedures (Signed)
Patient Name: Ariel Clark  MRN: [id number]  Epilepsy Attending: Charlsie Quest  Referring Physician/Provider: Dr Lindie Spruce Duration: 05/07/2021 3825 to 08/08/2021 0946   Patient history: 20 year old female with history of hypoxic/anoxic brain injury at time of birth was admitted for seizure-like episodes.  EEG for characterization of spells.   Level of alertness: Awake, asleep   AEDs during EEG study: None   Technical aspects: This EEG study was done with scalp electrodes positioned according to the 10-20 International system of electrode placement. Electrical activity was acquired at a sampling rate of 500Hz  and reviewed with a high frequency filter of 70Hz  and a low frequency filter of 1Hz . EEG data were recorded continuously and digitally stored.   Description: The posterior dominant rhythm consists of 10 Hz activity of moderate voltage (25-35 uV) seen predominantly in posterior head regions, symmetric and reactive to eye opening and eye closing. Sleep was characterized by vertex waves, sleep spindles (12 to 14 Hz), maximal frontocentral region.     IMPRESSION: This study is within normal limits. No seizures or epileptiform discharges were seen throughout the recording.   [name] 

## 2021-05-08 NOTE — Progress Notes (Signed)
EEG maintenance complete. No skin breakdown at FP1 FP2 redness at electrode site F8. Moved electrode slightly to prevent further breakdown. Continue to monitor

## 2021-05-08 NOTE — Progress Notes (Signed)
Subjective: No acute events overnight.  Father and brother at bedside.  ROS: negative except above  Examination  Vital signs in last 24 hours: Temp:  [97.7 F (36.5 C)-98.8 F (37.1 C)] 98.8 F (37.1 C) (06/23 0806) Pulse Rate:  [80-88] 86 (06/23 0806) Resp:  [18-20] 19 (06/23 0806) BP: (92-122)/(55-81) 122/81 (06/23 0806) SpO2:  [97 %-99 %] 99 % (06/23 0806)  General: lying in bed, not in apparent distress CVS: pulse-normal rate and rhythm RS: breathing comfortably, CTAB Extremities: normal,  warm Neuro: Awake, alert, oriented to time and place, able to follow simple one-step commands, cranial nerves 2-12 grossly intact, 5/5 in upper extremities, FTN grossly intact    Basic Metabolic Panel: Recent Labs  Lab 05/05/21 0905  NA 138  K 3.4*  CL 105  CO2 26  GLUCOSE 93  BUN 10  CREATININE 0.90  CALCIUM 9.0  MG 2.1  PHOS 3.1    CBC: Recent Labs  Lab 05/05/21 0905  WBC 6.0  NEUTROABS 3.1  HGB 14.2  HCT 41.2  MCV 86.0  PLT 231     Coagulation Studies: No results for input(s): LABPROT, INR in the last 72 hours.  Imaging 20 year old female with history of hypoxic/anoxic brain injury at time of birth was admitted for seizure-like episodes.   Convulsions -Continue video EEG monitoring for characterization of episodes -Continue to hold lamotrigine and encourage sleep deprivation for seizure provocation -Discussed with father at bedside that so far EEG has not shown any epileptiform discharges.  However the semiology of patient's episode is concerning for epileptic seizures.  Unfortunately, patient and family are leaving tomorrow to go out of town and patient will not be able to stay inpatient any longer.  Therefore, we will plan to start lamotrigine 100 mg tonight and resume home dose of 200 mg twice daily tomorrow -Continue seizure precautions -as needed IV Ativan for clinical seizure-like episodes   Schizoaffective with bipolar disorder -Continue home  medications   Constipation -continue as needed docusate   I have spent a total of 25  minutes with the patient reviewing hospital notes,  test results, labs and examining the patient as well as establishing an assessment and plan.  > 50% of time was spent in direct patient care.     Ariel Clark [facility]ile[facility] For questions after 5pm please refer to AMION to reach the Neurologist on call

## 2021-05-09 ENCOUNTER — Encounter (HOSPITAL_COMMUNITY): Payer: Self-pay | Admitting: Neurology

## 2021-05-09 MED ORDER — CLONAZEPAM 1 MG PO TABS: 1.0000 mg | ORAL_TABLET | ORAL | 0 refills | Status: DC | PRN

## 2021-05-09 MED ORDER — LAMOTRIGINE 100 MG PO TABS: ORAL_TABLET | ORAL | 3 refills | Status: DC

## 2021-05-09 NOTE — Procedures (Addendum)
Patient Name: Ariel Clark  MRN: [id number]  Epilepsy Attending: Charlsie Quest  Referring Physician/Provider: Dr Lindie Spruce Duration: 05/07/2021 2336 to 08/08/2021 0850   Patient history: 20 year old female with history of hypoxic/anoxic brain injury at time of birth was admitted for seizure-like episodes.  EEG for characterization of spells.   Level of alertness: Awake, asleep   AEDs during EEG study: None   Technical aspects: This EEG study was done with scalp electrodes positioned according to the 10-20 International system of electrode placement. Electrical activity was acquired at a sampling rate of 500Hz  and reviewed with a high frequency filter of 70Hz  and a low frequency filter of 1Hz . EEG data were recorded continuously and digitally stored.   Description: The posterior dominant rhythm consists of 10 Hz activity of moderate voltage (25-35 uV) seen predominantly in posterior head regions, symmetric and reactive to eye opening and eye closing. Sleep was characterized by vertex waves, sleep spindles (12 to 14 Hz), maximal frontocentral region.    Parts of study were difficult to interpret due to significant movement artifact  between 1830 to 1900 on 05/08/2021 and between 0012 to 0045 on 05/09/2021   IMPRESSION: This study is within normal limits. No seizures or epileptiform discharges were seen throughout the recording.   Baani Bober 

## 2021-05-09 NOTE — Plan of Care (Signed)
  Problem: Education: Goal: Knowledge of General Education information will improve Description: Including pain rating scale, medication(s)/side effects and non-pharmacologic comfort measures Outcome: Adequate for Discharge   Problem: Health Behavior/Discharge Planning: Goal: Ability to manage health-related needs will improve Outcome: Adequate for Discharge   Problem: Clinical Measurements: Goal: Ability to maintain clinical measurements within normal limits will improve Outcome: Adequate for Discharge Goal: Will remain free from infection Outcome: Adequate for Discharge Goal: Diagnostic test results will improve Outcome: Adequate for Discharge Goal: Respiratory complications will improve Outcome: Adequate for Discharge Goal: Cardiovascular complication will be avoided Outcome: Adequate for Discharge   Problem: Pain Managment: Goal: General experience of comfort will improve Outcome: Adequate for Discharge

## 2021-05-09 NOTE — Discharge Summary (Addendum)
Physician Discharge Summary  Patient ID: Ariel Clark MRN: [id number] DOB/AGE: July 19, 2001 20 y.o.  Admit date: 05/05/2021 Discharge date: 05/09/2021  Admission Diagnoses: Seizure  Discharge Diagnoses: Seizure  Discharged Condition: stable  Hospital Course: Ms. Mole was admitted to epilepsy monitoring unit from 05/05/2021 to 05/09/2021.  During this period, lamotrigine was discontinued, hyperventilation photic stimulation were performed as well as sleep deprivation was encouraged.  Her EEG was within normal limits without any ictal-interictal abnormality.  No events were captured.  Patient and family have plans to be out of town and therefore requesting to be discharged today.  Even though no events were captured, the semiology of patient's episodes (episodes happening during sleep, following downstaging episode, urinary incontinence) as well as improvement in episodes after starting lamotrigine suggest epileptic etiology.  However, episodes being triggered by stress as well as normal EEG so far does not raise concern for nonepileptic spells as well.  Her lamotrigine level was 10.3.  Lamotrigine is an antiepileptic but is also beneficial and nonepileptic patients.  Therefore, I would recommend increasing lamotrigine to 200 mg  QAM and 250 mg nightly.  If episodes persist or become more frequent, we will need another unit admission with plan for stay longer than 1 week if needed. Also prescribed Clonazepam ODT for GTC>5 mins or more than 2 in 30 mins.   Seizure precautions were discussed.  Consults: None  Significant Diagnostic Studies:   Treatments: Lamotrigine 200 mg nightly, increase to 250 mg at bedtime Clonazepam ODT 1mg  for GTC>5 mins or more than 2 in 30 mins.   Discharge Exam: Blood pressure 118/83, pulse 86, temperature 97.9 F (36.6 C), temperature source Oral, resp. rate 18, height 5' (1.524 m), weight 51.3 kg, last menstrual period 05/05/2021, SpO2 100 %.  General: lying in bed,  not in apparent distress CVS: pulse-normal rate and rhythm RS: breathing comfortably, CTAB Extremities: normal,  warm Neuro: Awake, alert, oriented to time and place, able to follow simple one-step commands, cranial nerves 2-12 grossly intact, 5/5 in upper extremities, FTN grossly intact   Disposition: Discharge disposition: 01-Home or Self Care    Discharge Instructions     Diet - low sodium heart healthy   Complete by: As directed    Discharge instructions   Complete by: As directed    Seizure precautions: Per [facility] statutes, patients with seizures are not allowed to drive until they have been seizure-free for six months and cleared by a physician    Use caution when using heavy equipment or power tools. Avoid working on ladders or at heights. Take showers instead of baths. Ensure the water temperature is not too high on the home water heater. Do not go swimming alone. Do not lock yourself in a room alone (i.e. bathroom). When caring for infants or small children, sit down when holding, feeding, or changing them to minimize risk of injury to the child in the event you have a seizure. Maintain good sleep hygiene. Avoid alcohol.    If patient has another seizure, call 911 and bring them back to the ED if: A.  The seizure lasts longer than 5 minutes.      B.  The patient doesn't wake shortly after the seizure or has new problems such as difficulty seeing, speaking or moving following the seizure C.  The patient was injured during the seizure D.  The patient has a temperature over 102 F (39C) E.  The patient vomited during the seizure and now is having  trouble breathing    During the Seizure   - First, ensure adequate ventilation and place patients on the floor on their left side  Loosen clothing around the neck and ensure the airway is patent. If the patient is clenching the teeth, do not force the mouth open with any object as this can cause severe damage - Remove all  items from the surrounding that can be hazardous. The patient may be oblivious to what's happening and may not even know what he or she is doing. If the patient is confused and wandering, either gently guide him/her away and block access to outside areas - Reassure the individual and be comforting - Call 911. In most cases, the seizure ends before EMS arrives. However, there are cases when seizures may last over 3 to 5 minutes. Or the individual may have developed breathing difficulties or severe injuries. If a pregnant patient or a person with diabetes develops a seizure, it is prudent to call an ambulance. - Finally, if the patient does not regain full consciousness, then call EMS. Most patients will remain confused for about 45 to 90 minutes after a seizure, so you must use judgment in calling for help. - Avoid restraints but make sure the patient is in a bed with padded side rails - Place the individual in a lateral position with the neck slightly flexed; this will help the saliva drain from the mouth and prevent the tongue from falling backward - Remove all nearby furniture and other hazards from the area - Provide verbal assurance as the individual is regaining consciousness - Provide the patient with privacy if possible - Call for help and start treatment as ordered by the caregiver    After the Seizure (Postictal Stage)   After a seizure, most patients experience confusion, fatigue, muscle pain and/or a headache. Thus, one should permit the individual to sleep. For the next few days, reassurance is essential. Being calm and helping reorient the person is also of importance.   Most seizures are painless and end spontaneously. Seizures are not harmful to others but can lead to complications such as stress on the lungs, brain and the heart. Individuals with prior lung problems may develop labored breathing and respiratory distress.      Allergies as of 05/09/2021   No Known Allergies       Medication List     STOP taking these medications    ALPRAZolam 0.5 MG tablet Commonly known as: XANAX   FLUoxetine 20 MG capsule Commonly known as: PROZAC   prazosin 1 MG capsule Commonly known as: MINIPRESS       TAKE these medications    amantadine 100 MG capsule Commonly known as: SYMMETREL Take by mouth 2 (two) times daily.   [place] 662 MG/2.4ML prefilled syringe Generic drug: ARIPiprazole Lauroxil ER Inject 662 mg into the muscle once.   busPIRone 5 MG tablet Commonly known as: BUSPAR Take 10 mg by mouth 3 (three) times daily.   clonazePAM 1 MG tablet Commonly known as: KlonoPIN Take 1 tablet (1 mg total) by mouth as needed for up to 10 days (for grand mal sz lasting over 5 mins or more than 2 sz within 30 mins. Please watch for breathing/respiratory depression when administered).   docusate sodium 100 MG capsule Commonly known as: COLACE Take 100 mg by mouth daily. 1 tab in the morning.   lamoTRIgine 100 MG tablet Commonly known as: LaMICtal Take 2 tabs in morning and 2.5 tabs at bedtime  What changed:  how much to take how to take this when to take this additional instructions   Melatonin 10 MG Tabs Take 10 mg by mouth at bedtime as needed (Sleep). 1 tab at bedtime   QUEtiapine 50 MG tablet Commonly known as: SEROQUEL Take 100 mg by mouth at bedtime.       I have spent a total of  40 minutes with the patient reviewing hospital notes,  test results, labs and examining the patient as well as establishing an assessment and plan that was discussed personally with the patient's father.  > 50% of time was spent in direct patient care.       Signed: Charlsie Quest 05/09/2021, 9:52 AM

## 2021-05-09 NOTE — Progress Notes (Signed)
EMU maint complete - serviced several leads. And placed head wrap on to secure pasted  leads.

## 2021-05-09 NOTE — Discharge Instructions (Addendum)
Ariel Clark was admitted to epilepsy monitoring unit from 05/05/2021 to 05/09/2021.  During this period, lamotrigine was discontinued, hyperventilation and photic stimulation were performed as well as sleep deprivation was encouraged.  Patient did not have any of her typical events.  Her EEG was within normal limits.  Discussed with father that the semiology of her episodes (happening during sleep, falling down the stairs as well as urinary incontinence) are concerning for epileptic etiology.  There is also been an improvement in the episodes after adjusting lamotrigine which also suggests epileptic etiology.  However, stress being a trigger for these episodes in addition to normal EEG so far does bring up concern for nonepileptic spells.  For now, I would recommend increasing lamotrigine to 200 mg every morning and 250 mg nightly.  If episodes persist or become more frequent, I would recommend repeat EMU admission with prolonged stay for characterization of spells.  Also prescribed Clonazepam ODT for GTC>5 mins or more than 2 in 30 mins,. Seizure precautions were discussed.

## 2021-05-09 NOTE — Progress Notes (Signed)
Discontinued LTM/EMU study.   Notified [facility] monitoring. Removed EEG electrodes.  Skin breakdown observed at multiple electrodes sites including EKG locations on chest (redness, irritation.)  Notified RN .

## 2021-05-09 NOTE — Plan of Care (Signed)

## 2021-05-13 ENCOUNTER — Telehealth: Payer: Self-pay | Admitting: Neurology

## 2021-05-13 ENCOUNTER — Encounter: Payer: Self-pay | Admitting: Neurology

## 2021-05-13 NOTE — Telephone Encounter (Signed)
I called her father, EMU admission resulted in no seizure events, but Dr. Melynda Ripple very concerned for epileptic seizures based on video her father recently sent in.  As of today, will increase Lamictal 250 mg twice daily. Call for any more seizure events, Lamictal level was 10.3, 05/05/21. We may not be able to increase Lamictal much further, if that is the case, may add on Keppra, but will watch for moodiness.

## 2021-05-13 NOTE — Progress Notes (Signed)
Patient's father contacted me stating patient had another seizure while [place].  He said patient was going up a steps at the [employer] when her leg went to the opening of the top step which caused her to be scared and panic and go into a seizure.  He was able to take her to the ER and they did not see any fractures, did give her a tetanus shot.  He was also able to e-mail me a video of the seizure.  On review of the video, patient is laying on the stairs on her right side, had rhythmic generalized tonic clonic activity, no forced gaze deviation ( of note, video was started after about a minute of the seizure). Seizure lasted for about 40 minutes on the video and about a minute prior to video. After the seizure, patient had frothing at mouth followed by urinary incontinence. She was laying with eyes open, not responding. Eventually she started responding ( nodding yes and no) after about 5 minutes.   I called patient's father Ariel Clark and discussed with him that after review of the seizure video, I strongly suspect this was an epileptic seizure.  Unfortunately, without EEG is difficult to know if this was a focal seizure that eventually generalized versus a seizure with generalized onset.  Patient's father states Ariel Clark was taking 200 mg of Motrin morning and 250 mg at bedtime without any side effects.  I recommended increasing it to 250 mg twice daily.  I also told him that I will share all this information with patient's primary neurologist Dr. Anne Hahn and that Dr. Anne Hahn will be the  physician who will help manage hopes epilepsy as an outpatient.   If seizures become more frequent, we can consider adding another antiseizure medication (patient has been on Depakote and had significant side effects).  Could consider adding Keppra.  If seizures are not controlled on to maximum dose AEDs, will need repeat EMU evaluation for possible VNS placement.  Ariel Clark Annabelle Harman

## 2021-05-16 ENCOUNTER — Encounter: Payer: Self-pay | Admitting: Neurology

## 2021-05-28 ENCOUNTER — Encounter: Payer: Self-pay | Admitting: Neurology

## 2021-06-25 ENCOUNTER — Encounter: Payer: Self-pay | Admitting: Neurology

## 2021-06-26 ENCOUNTER — Other Ambulatory Visit: Payer: Self-pay | Admitting: Neurology

## 2021-06-26 MED ORDER — LAMOTRIGINE 100 MG PO TABS: ORAL_TABLET | ORAL | 1 refills | Status: DC

## 2021-07-02 ENCOUNTER — Encounter: Payer: Self-pay | Admitting: Neurology

## 2021-07-02 ENCOUNTER — Other Ambulatory Visit: Payer: Self-pay

## 2021-07-02 ENCOUNTER — Ambulatory Visit (INDEPENDENT_AMBULATORY_CARE_PROVIDER_SITE_OTHER): Payer: Medicare Other | Admitting: Neurology

## 2021-07-02 VITALS — BP 111/76 | HR 87 | Ht 60.0 in | Wt 106.0 lb

## 2021-07-02 DIAGNOSIS — R569 Unspecified convulsions: Secondary | ICD-10-CM

## 2021-07-02 DIAGNOSIS — F70 Mild intellectual disabilities: Secondary | ICD-10-CM | POA: Diagnosis not present

## 2021-07-02 MED ORDER — LEVETIRACETAM 250 MG PO TABS: ORAL_TABLET | ORAL | 3 refills | Status: DC

## 2021-07-02 NOTE — Progress Notes (Signed)
Reason for visit: Intractable seizures  Ariel Clark is an 20 y.o. female  History of present illness:  Ariel Clark is a 20 year old right-handed white female with a history of premature birth, intellectual deficits, and intractable seizures.  The patient is having seizures about once every 2 to 3 weeks or so.  Some of the seizures will come out of sleep but most are during the daytime.  The mother indicates that she oftentimes will have a sensation of anxiety prior to the onset of the seizure.  Some of the seizures can be quite prolonged.  She may have incontinence of the bladder, she keeps her eyes open during the seizure event.  She will have bruising at times but no serious injury has occurred so far.  The patient is on lamotrigine currently, she did not tolerate the Depakote well because of increased tremors.  The patient has undergone a prolonged EEG overnight that was normal.  Routine EEG has also been normal in the past.  She returns to the office today for an evaluation.  She recently was increased on the Lamictal last week to a 250 mg twice daily dose, she tolerates this well.  Past Medical History:  Diagnosis Date   Medical history non-contributory    Mental disorder    Premature birth    7 weeks   Seizure disorder (HCC) 07/05/2020   Seizures (HCC) 04/2020    Past Surgical History:  Procedure Laterality Date   ADENOIDECTOMY, TONSILLECTOMY AND MYRINGOTOMY WITH TUBE PLACEMENT     EYE EXAMINATION UNDER ANESTHESIA W/ RETINAL CRYOTHERAPY AND RETINAL LASER      Family History  Problem Relation Age of Onset   Cancer Maternal Grandmother 24   Other Paternal Grandmother 16       Murdered    Social history:  reports that she has never smoked. She has never used smokeless tobacco. She reports that she does not drink alcohol and does not use drugs.   No Known Allergies  Medications:  Prior to Admission medications   Medication Sig Start Date End Date Taking? Authorizing  Provider  amantadine (SYMMETREL) 100 MG capsule Take by mouth 2 (two) times daily. 04/16/21   [provider]  ARISTADA 662 MG/2.4ML prefilled syringe Inject 662 mg into the muscle once. 06/26/20   [provider]  busPIRone (BUSPAR) 5 MG tablet Take 10 mg by mouth 3 (three) times daily. 01/30/21   [provider]  clonazePAM (KLONOPIN) 1 MG tablet Take 1 tablet (1 mg total) by mouth as needed for up to 10 days (for grand mal sz lasting over 5 mins or more than 2 sz within 30 mins. Please watch for breathing/respiratory depression when administered). 05/09/21 05/19/21  Charlsie Quest, MD  docusate sodium (COLACE) 100 MG capsule Take 100 mg by mouth daily. 1 tab in the morning.    [provider]  lamoTRIgine (LAMICTAL) 100 MG tablet Take 2.5 tablets twice a day 06/26/21   York Spaniel, MD  Melatonin 10 MG TABS Take 10 mg by mouth at bedtime as needed (Sleep). 1 tab at bedtime    [provider]  QUEtiapine (SEROQUEL) 50 MG tablet Take 100 mg by mouth at bedtime.    [provider]    ROS:  Out of a complete 14 system review of symptoms, the patient complains only of the following symptoms, and all other reviewed systems are negative.  Seizures Anxiety  Blood pressure 111/76, pulse 87, height 5' (1.524  m), weight 106 lb (48.1 kg).  Physical Exam  General: The patient is alert and cooperative at the time of the examination.  Skin: No significant peripheral edema is noted.   Neurologic Exam  Mental status: The patient is alert and oriented x 3 at the time of the examination. The patient has apparent normal recent and remote memory, with an apparently normal attention span and concentration ability.   Cranial nerves: Facial symmetry is present. Speech is normal, no aphasia or dysarthria is noted. Extraocular movements are full. Visual fields are full.  Motor: The patient has good strength in all 4 extremities.  Sensory examination:  Soft touch sensation is symmetric on the face, arms, and legs.  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  Coordination: The patient has good finger-nose-finger and heel-to-shin bilaterally.  Gait and station: The patient has a normal gait. Tandem gait could not be done secondary to apraxia on the part of the patient.  Romberg is negative. No drift is seen.  Reflexes: Deep tendon reflexes are symmetric.   Overnight EEG 05/06/21:  IMPRESSION: This study is within normal limits. No seizures or epileptiform discharges were seen throughout the recording.   Assessment/Plan:  1.  Intractable seizures  The patient continues to have ongoing seizure type events.  The patient was just recently increased on the Lamictal, we will check levels in the future.  We will add Keppra to the current dose, but the mother is to look out for increased anxiety on the medication.  She we will start 250 mg of Keppra twice daily for 2 weeks and then go to 500 mg twice daily.  She will follow-up in 4 months, in the future she can be followed through Dr. Teresa Coombs.  If the seizures continue on dual therapy, I would consider referral for video EEG monitoring to look for a possible seizure disorder that may be amenable to surgery.  Marlan Palau MD 07/02/2021 9:03 AM  [facility] [address] [place], [place] [address]  Phone [phone] Fax [phone]

## 2021-07-08 ENCOUNTER — Telehealth: Payer: Self-pay | Admitting: Neurology

## 2021-07-08 MED ORDER — CLONAZEPAM 1 MG PO TBDP: ORAL_TABLET | ORAL | 0 refills | Status: DC

## 2021-07-08 NOTE — Telephone Encounter (Signed)
[facility]) called, clonazePAM (KLONOPIN) 1 MG tablet (Expired) was prescribed while in the hospital on 6/20 for seizures. Does she still need to be taking Clonazepam oral dissolvable tablet and can Dr. Anne Hahn send a prescription. Would like a call from the nurse.

## 2021-07-08 NOTE — Telephone Encounter (Signed)
I called and talk with the father.  The patient was given orally disintegrating clonazepam as a rescue drug for seizures through Dr. Melynda Ripple.  When they went to the pharmacy to get the prescription, they were given regular tablets, not the orally disintegrating tablets.  I will send in a prescription for the orally disintegrating tablets.

## 2021-07-25 ENCOUNTER — Other Ambulatory Visit: Payer: Self-pay | Admitting: Neurology

## 2021-07-27 ENCOUNTER — Encounter: Payer: Self-pay | Admitting: Neurology

## 2021-08-27 ENCOUNTER — Other Ambulatory Visit: Payer: Self-pay | Admitting: Neurology

## 2021-08-27 MED ORDER — LEVETIRACETAM 750 MG PO TABS
750.0000 mg | ORAL_TABLET | Freq: Two times a day (BID) | ORAL | 1 refills | Status: DC
Start: 2021-08-27 — End: 2022-02-02

## 2021-08-30 ENCOUNTER — Encounter: Payer: Self-pay | Admitting: Neurology

## 2021-10-29 ENCOUNTER — Ambulatory Visit (INDEPENDENT_AMBULATORY_CARE_PROVIDER_SITE_OTHER): Payer: Medicare Other | Admitting: Neurology

## 2021-10-29 ENCOUNTER — Encounter: Payer: Self-pay | Admitting: Neurology

## 2021-10-29 VITALS — BP 119/76 | HR 108 | Ht 60.0 in | Wt 104.5 lb

## 2021-10-29 DIAGNOSIS — F70 Mild intellectual disabilities: Secondary | ICD-10-CM

## 2021-10-29 DIAGNOSIS — E559 Vitamin D deficiency, unspecified: Secondary | ICD-10-CM

## 2021-10-29 DIAGNOSIS — G40909 Epilepsy, unspecified, not intractable, without status epilepticus: Secondary | ICD-10-CM | POA: Diagnosis not present

## 2021-10-29 DIAGNOSIS — Z5181 Encounter for therapeutic drug level monitoring: Secondary | ICD-10-CM | POA: Diagnosis not present

## 2021-10-29 NOTE — Progress Notes (Signed)
Reason for visit: Intractable seizures  Ariel Clark is an 20 y.o. female  INTERVAL HISTORY 10/29/2021 Patient present today for follow-up with parents, last visit was in August, at that time plan was to add levetiracetam to her regimen.  Currently she is taking levetiracetam 750 mg twice daily, denies any side effect from the medication, and she is also taking lamotrigine 250 mg twice daily.  Family report that her seizure frequency has decreased, her last seizure was in the beginning of November when she used to have 2 seizures per month.  No new concern.    Her seizure history started last year, she has tried Depakote, lamotrigine and levetiracetam.  Had side effects from Depakote of dizziness and imbalance.     History of present illness:  Ariel Clark is a 20 year old right-handed white female with a history of premature birth, intellectual deficits, and intractable seizures.  The patient is having seizures about once every 2 to 3 weeks or so.  Some of the seizures will come out of sleep but most are during the daytime.  The mother indicates that she oftentimes will have a sensation of anxiety prior to the onset of the seizure.  Some of the seizures can be quite prolonged.  She may have incontinence of the bladder, she keeps her eyes open during the seizure event.  She will have bruising at times but no serious injury has occurred so far.  The patient is on lamotrigine currently, she did not tolerate the Depakote well because of increased tremors.  The patient has undergone a prolonged EEG overnight that was normal.  Routine EEG has also been normal in the past.  She returns to the office today for an evaluation.  She recently was increased on the Lamictal last week to a 250 mg twice daily dose, she tolerates this well.   Past Medical History:  Diagnosis Date   Medical history non-contributory    Mental disorder    Premature birth    81 weeks   Seizure disorder (HCC) 07/05/2020    Seizures (HCC) 04/2020    Past Surgical History:  Procedure Laterality Date   ADENOIDECTOMY, TONSILLECTOMY AND MYRINGOTOMY WITH TUBE PLACEMENT     EYE EXAMINATION UNDER ANESTHESIA W/ RETINAL CRYOTHERAPY AND RETINAL LASER      Family History  Problem Relation Age of Onset   Cancer Maternal Grandmother 41   Other Paternal Grandmother 48       Murdered    Social history:  reports that she has never smoked. She has never used smokeless tobacco. She reports that she does not drink alcohol and does not use drugs.   No Known Allergies  Medications:  Prior to Admission medications   Medication Sig Start Date End Date Taking? Authorizing Provider  amantadine (SYMMETREL) 100 MG capsule Take by mouth 2 (two) times daily. 04/16/21   [provider]  ARISTADA 662 MG/2.4ML prefilled syringe Inject 662 mg into the muscle once. 06/26/20   [provider]  busPIRone (BUSPAR) 5 MG tablet Take 10 mg by mouth 3 (three) times daily. 01/30/21   [provider]  clonazePAM (KLONOPIN) 1 MG tablet Take 1 tablet (1 mg total) by mouth as needed for up to 10 days (for grand mal sz lasting over 5 mins or more than 2 sz within 30 mins. Please watch for breathing/respiratory depression when administered). 05/09/21 05/19/21  Charlsie Quest, MD  docusate sodium (COLACE) 100 MG capsule Take 100 mg by mouth daily.  1 tab in the morning.    [provider]  lamoTRIgine (LAMICTAL) 100 MG tablet Take 2.5 tablets twice a day 06/26/21   York Spaniel, MD  Melatonin 10 MG TABS Take 10 mg by mouth at bedtime as needed (Sleep). 1 tab at bedtime    [provider]  QUEtiapine (SEROQUEL) 50 MG tablet Take 100 mg by mouth at bedtime.    [provider]    ROS:  Out of a complete 14 system review of symptoms, the patient complains only of the following symptoms, and all other reviewed systems are negative.  Seizures Anxiety  Blood pressure 119/76, pulse (!) 108, height 5'  (1.524 m), weight 104 lb 8 oz (47.4 kg).  Physical Exam  General: The patient is alert and cooperative at the time of the examination.  Skin: No significant peripheral edema is noted.   Neurologic Exam  Mental status: The patient is alert and oriented x 3 at the time of the examination. The patient has apparent normal recent and remote memory, with an apparently normal attention span and concentration ability. Naming, repetition intact.   Cranial nerves: Facial symmetry is present. Speech is normal, no aphasia or dysarthria is noted. Extraocular movements are full. Visual fields are full.  Motor: The patient has good strength in all 4 extremities.  Sensory examination: Soft touch sensation is symmetric on the face, arms, and legs.  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  Coordination: The patient has good finger-nose-finger and heel-to-shin bilaterally.  Gait and station: The patient has a normal gait.   Reflexes: Deep tendon reflexes are symmetric.   Overnight EEG 05/06/21:  IMPRESSION: This study is within normal limits. No seizures or epileptiform discharges were seen throughout the recording.   Assessment/Plan:  1.  Intractable seizures  Her seizure frequency has in proved since being on levetiracetam.  I will continue her on levetiracetam 750 mg twice daily and lamotrigine 250 mg twice daily.  I will check level today, will also check her BMP and vitamin D level.  I will see her in 3 months for follow-up.    Continue current medications   Lamotrigine 250 mg BID   Keppra 750 mg BID  We will check labs today including Keppra level, Lamotrigine level and Vit D level.  Return in 3 months    Windell Norfolk, MD 10/29/2021 9:24 AM  [facility] [address] [place], [place] [address]  Phone [phone] Fax [phone]

## 2021-10-29 NOTE — Patient Instructions (Signed)
Continue current medications   Lamotrigine 250 mg BID   Keppra 750 mg BID  We will check labs today including Keppra level, Lamotrigine level and Vit D level.  Return in 3 months

## 2021-10-30 LAB — BASIC METABOLIC PANEL
BUN/Creatinine Ratio: 16 (ref 9–23)
BUN: 17 mg/dL (ref 6–20)
CO2: 23 mmol/L (ref 20–29)
Calcium: 9.9 mg/dL (ref 8.7–10.2)
Chloride: 104 mmol/L (ref 96–106)
Creatinine, Ser: 1.04 mg/dL — ABNORMAL HIGH (ref 0.57–1.00)
Glucose: 89 mg/dL (ref 70–99)
Potassium: 4.1 mmol/L (ref 3.5–5.2)
Sodium: 141 mmol/L (ref 134–144)
eGFR: 79 mL/min/{1.73_m2} (ref >59)

## 2021-10-30 LAB — LEVETIRACETAM LEVEL: Levetiracetam Lvl: 45.9 ug/mL — ABNORMAL HIGH (ref 10.0–40.0)

## 2021-10-30 LAB — VITAMIN D 25 HYDROXY (VIT D DEFICIENCY, FRACTURES): Vit D, 25-Hydroxy: 22.1 ng/mL — ABNORMAL LOW (ref 30.0–100.0)

## 2021-10-30 LAB — LAMOTRIGINE LEVEL: Lamotrigine Lvl: 16.2 ug/mL (ref 2.0–20.0)

## 2021-10-31 ENCOUNTER — Telehealth: Payer: Self-pay | Admitting: Neurology

## 2021-10-31 MED ORDER — VITAMIN D (ERGOCALCIFEROL) 1.25 MG (50000 UNIT) PO CAPS: 50000.0000 [IU] | ORAL_CAPSULE | ORAL | 0 refills | Status: DC

## 2021-10-31 NOTE — Telephone Encounter (Signed)
Vit D supplement sent to pharmacy  Windell Norfolk, MD

## 2021-12-16 ENCOUNTER — Other Ambulatory Visit: Payer: Self-pay | Admitting: Neurology

## 2021-12-24 ENCOUNTER — Other Ambulatory Visit: Payer: Self-pay

## 2021-12-24 MED ORDER — LAMOTRIGINE 100 MG PO TABS: ORAL_TABLET | ORAL | 0 refills | Status: DC

## 2021-12-24 NOTE — Progress Notes (Signed)
Rx refilled.

## 2022-01-01 ENCOUNTER — Other Ambulatory Visit: Payer: Self-pay | Admitting: Neurology

## 2022-01-07 ENCOUNTER — Other Ambulatory Visit: Payer: Self-pay | Admitting: Neurology

## 2022-01-27 ENCOUNTER — Other Ambulatory Visit: Payer: Self-pay | Admitting: Neurology

## 2022-01-28 ENCOUNTER — Other Ambulatory Visit: Payer: Self-pay | Admitting: *Deleted

## 2022-01-28 ENCOUNTER — Encounter: Payer: Self-pay | Admitting: Neurology

## 2022-01-28 ENCOUNTER — Ambulatory Visit (INDEPENDENT_AMBULATORY_CARE_PROVIDER_SITE_OTHER): Payer: Medicare Other | Admitting: Neurology

## 2022-01-28 VITALS — BP 113/70 | HR 88 | Ht 60.0 in | Wt 104.0 lb

## 2022-01-28 DIAGNOSIS — F70 Mild intellectual disabilities: Secondary | ICD-10-CM | POA: Diagnosis not present

## 2022-01-28 DIAGNOSIS — G40909 Epilepsy, unspecified, not intractable, without status epilepticus: Secondary | ICD-10-CM

## 2022-01-28 DIAGNOSIS — E559 Vitamin D deficiency, unspecified: Secondary | ICD-10-CM

## 2022-01-28 MED ORDER — VITAMIN D (ERGOCALCIFEROL) 1.25 MG (50000 UNIT) PO CAPS: 50000.0000 [IU] | ORAL_CAPSULE | ORAL | 0 refills | Status: DC

## 2022-01-28 MED ORDER — VITAMIN D 600 IU CAPSULE SWOG S0812: 600.0000 [IU] | ORAL_CAPSULE | Freq: Every day | ORAL | 6 refills | Status: AC

## 2022-01-28 MED ORDER — LAMOTRIGINE 100 MG PO TABS: 200.0000 mg | ORAL_TABLET | Freq: Two times a day (BID) | ORAL | 0 refills | Status: DC

## 2022-01-28 NOTE — Patient Instructions (Addendum)
Decrease Lamotrigine to 200 mg twice daily  ?Continue with Vitamin D supplement. ?Continue your other medications ?Follow up in 6 months  ?

## 2022-01-28 NOTE — Progress Notes (Signed)
? ? ?Reason for visit: Intractable seizures ? ?Ariel Clark is an 21 y.o. female ? ?INTERVAL HISTORY 01/28/2022:  ?Patient presents today for follow-up, she is accompanied by her parents.  Since last visit in December no additional seizures.  She is tolerating the medication well, currently she is on levetiracetam 750 mg twice daily and lamotrigine 250 mg twice daily.  Parents reported that usually at the end of the day she does complain of generalized weakness, feeling tired and not wanting to walk.  There is also report of patient feeling unsteady but no seizures.    ? ? ?INTERVAL HISTORY 10/29/2021 ?Patient present today for follow-up with parents, last visit was in August, at that time plan was to add levetiracetam to her regimen.  Currently she is taking levetiracetam 750 mg twice daily, denies any side effect from the medication, and she is also taking lamotrigine 250 mg twice daily.  Family report that her seizure frequency has decreased, her last seizure was in the beginning of November when she used to have 2 seizures per month.  No new concern.   ? ?Her seizure history started last year, she has tried Depakote, lamotrigine and levetiracetam.  Had side effects from Depakote of dizziness and imbalance.   ? ? ?History of present illness: ? ?Ms. Ariel Clark is a 21 year old right-handed white female with a history of premature birth, intellectual deficits, and intractable seizures.  The patient is having seizures about once every 2 to 3 weeks or so.  Some of the seizures will come out of sleep but most are during the daytime.  The mother indicates that she oftentimes will have a sensation of anxiety prior to the onset of the seizure.  Some of the seizures can be quite prolonged.  She may have incontinence of the bladder, she keeps her eyes open during the seizure event.  She will have bruising at times but no serious injury has occurred so far.  The patient is on lamotrigine currently, she did not tolerate the  Depakote well because of increased tremors.  The patient has undergone a prolonged EEG overnight that was normal.  Routine EEG has also been normal in the past.  She returns to the office today for an evaluation.  She recently was increased on the Lamictal last week to a 250 mg twice daily dose, she tolerates this well. ? ? ?Past Medical History:  ?Diagnosis Date  ? Medical history non-contributory   ? Mental disorder   ? Premature birth   ? 26 weeks  ? Seizure disorder ([facility]) 07/05/2020  ? Seizures ([facility]CC) 04/2020  ? ? ?Past Surgical History:  ?Procedure Laterality Date  ? ADENOIDECTOMY, TONSILLECTOMY AND MYRINGOTOMY WITH TUBE PLACEMENT    ? EYE EXAMINATION UNDER ANESTHESIA W/ RETINAL CRYOTHERAPY AND RETINAL LASER    ? ? ?Family History  ?Problem Relation Age of Onset  ? Cancer Maternal Grandmother 23  ? Other Paternal Grandmother 60  ?     Murdered  ? ? ?Social history:  reports that she has never smoked. She has never used smokeless tobacco. She reports that she does not drink alcohol and does not use drugs. ? ? No Known Allergies ? ?Medications:  ? ?Current Outpatient Medications:  ?  amantadine (SYMMETREL) 100 MG capsule, Take by mouth 2 (two) times daily., Disp: , Rfl:  ?  ARISTADA 662 MG/2.4ML prefilled syringe, Inject 662 mg into the muscle once., Disp: , Rfl:  ?  busPIRone (BUSPAR) 5 MG tablet, Take 10 mg  by mouth 3 (three) times daily., Disp: , Rfl:  ?  clonazePAM (KLONOPIN) 1 MG disintegrating tablet, TAKE 1 TABLET DAILY IF NEEDED FOR PROLONGED SEIZURE GREATER THAN 5 MINUTES OR 2 SEIZURES WITHIN 30 MINUTES, Disp: 15 tablet, Rfl: 3 ?  docusate sodium (COLACE) 100 MG capsule, Take 100 mg by mouth daily. 1 tab in the morning., Disp: , Rfl:  ?  Investigational vitamin D 600 UNITS capsule SWOG S0812, Take 1 capsule (600 Units total) by mouth daily. Take with food., Disp: 30 capsule, Rfl: 6 ?  levETIRAcetam (KEPPRA) 750 MG tablet, Take 1 tablet (750 mg total) by mouth 2 (two) times daily., Disp: 180 tablet, Rfl:  1 ?  LORazepam (ATIVAN) 1 MG tablet, Take 1 mg by mouth as needed., Disp: , Rfl:  ?  Melatonin 10 MG TABS, Take 10 mg by mouth at bedtime as needed (Sleep). 1 tab at bedtime, Disp: , Rfl:  ?  QUEtiapine (SEROQUEL) 50 MG tablet, Take 100 mg by mouth at bedtime., Disp: , Rfl:  ?  lamoTRIgine (LAMICTAL) 100 MG tablet, Take 2 tablets (200 mg total) by mouth 2 (two) times daily. Take 2 tablets twice a day, Disp: 450 tablet, Rfl: 0 ?  Vitamin D, Ergocalciferol, (DRISDOL) 1.25 MG (50000 UNIT) CAPS capsule, Take 1 capsule (50,000 Units total) by mouth every 7 (seven) days., Disp: 8 capsule, Rfl: 0  ? ?ROS: ? ?Out of a complete 14 system review of symptoms, the patient complains only of the following symptoms, and all other reviewed systems are negative. ? ?Seizures ?Anxiety ? ?Blood pressure 113/70, pulse 88, height 5' (1.524 m), weight 104 lb (47.2 kg). ? ?Physical Exam ? ?General: The patient is alert and cooperative at the time of the examination. ? ?Skin: No significant peripheral edema is noted. ? ? ?Neurologic Exam ? ?Mental status: The patient is alert and oriented x 3 at the time of the examination. The patient has apparent normal recent and remote memory, with an apparently normal attention span and concentration ability. Naming, repetition intact. ? ? ?Cranial nerves: Facial symmetry is present. Speech is normal, no aphasia or dysarthria is noted. Extraocular movements are full. Visual fields are full. ? ?Motor: The patient has good strength in all 4 extremities. ? ?Sensory examination: Soft touch sensation is symmetric on the face, arms, and legs.  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 ? ?Coordination: The patient has good finger-nose-finger and heel-to-shin bilaterally. ? ?Gait and station: The patient has a normal gait.  ? ?Reflexes: Deep tendon reflexes are symmetric. ? ? ?Overnight EEG 05/06/21: ? ?IMPRESSION: ?This study is within normal limits. No seizures or epileptiform discharges  were seen throughout the recording. ? ? ?Assessment/Plan: ? ?1.  Intractable seizures ? ?Her seizure frequency has improved since being on levetiracetam.  Currently she is seizure-free since November 2022.  There is report of possible dizziness, she is on a higher dose of lamotrigine.  At this point I will continue the patient on levetiracetam 750 mg twice daily and decrease the lamotrigine to 200 mg twice daily.  I will also continue her on vitamin D supplement. We will continue to monitor her.  Advised parents to contact me if she does have an additional seizure. ? ? ?Patient Instructions  ?Decrease Lamotrigine to 200 mg twice daily  ?Continue with Vitamin D supplement. ?Continue your other medications ?Follow up in 6 months  ? ? ?Windell NorfolkAmadou Dedrick Heffner, MD ?01/28/2022 8:59 PM ? ?[facility] ?[address] ?  [place], [place] [address] ? ?Phone [phone] Fax [phone] ? ?

## 2022-02-02 ENCOUNTER — Other Ambulatory Visit: Payer: Self-pay | Admitting: *Deleted

## 2022-02-02 MED ORDER — LEVETIRACETAM 750 MG PO TABS: 750.0000 mg | ORAL_TABLET | Freq: Two times a day (BID) | ORAL | 1 refills | Status: DC

## 2022-02-16 ENCOUNTER — Encounter: Payer: Self-pay | Admitting: Neurology

## 2022-03-25 ENCOUNTER — Other Ambulatory Visit: Payer: Self-pay | Admitting: *Deleted

## 2022-03-25 MED ORDER — LEVETIRACETAM 750 MG PO TABS: 750.0000 mg | ORAL_TABLET | Freq: Two times a day (BID) | ORAL | 1 refills | Status: DC

## 2022-03-25 MED ORDER — LAMOTRIGINE 100 MG PO TABS: 200.0000 mg | ORAL_TABLET | Freq: Two times a day (BID) | ORAL | 0 refills | Status: DC

## 2022-03-26 ENCOUNTER — Telehealth: Payer: Self-pay | Admitting: Neurology

## 2022-03-26 NOTE — Telephone Encounter (Signed)
Freda from [facility] called needing the pt's Vitamin D2 1.25mg  5000 IU capsule and the pt's clonazePAM (KLONOPIN) 1 MG disintegrating tablet sent in to them.  ?

## 2022-03-26 NOTE — Telephone Encounter (Signed)
Dr. Teresa Coombs prescribed vitamin D 50,000 IU once weekly for 8 weeks then directed follow up with PCP. Clonazepam is a controlled prn medication and will stay at the local pharmacy. I have spoke to Ariel Clark's mother and provided this information. I also called [facility] and spoke to [place]. He will update her profile. ?

## 2022-07-02 ENCOUNTER — Encounter: Payer: Self-pay | Admitting: Neurology

## 2022-07-02 ENCOUNTER — Other Ambulatory Visit: Payer: Self-pay | Admitting: Neurology

## 2022-07-02 MED ORDER — LAMOTRIGINE 100 MG PO TABS: 250.0000 mg | ORAL_TABLET | Freq: Two times a day (BID) | ORAL | 3 refills | Status: DC

## 2022-07-28 ENCOUNTER — Other Ambulatory Visit: Payer: Self-pay | Admitting: Neurology

## 2022-08-04 ENCOUNTER — Other Ambulatory Visit: Payer: Self-pay | Admitting: Neurology

## 2022-08-05 ENCOUNTER — Ambulatory Visit (INDEPENDENT_AMBULATORY_CARE_PROVIDER_SITE_OTHER): Payer: Medicare Other | Admitting: Neurology

## 2022-08-05 ENCOUNTER — Encounter: Payer: Self-pay | Admitting: Neurology

## 2022-08-05 VITALS — BP 114/78 | HR 99 | Ht 60.0 in | Wt 112.0 lb

## 2022-08-05 DIAGNOSIS — G40909 Epilepsy, unspecified, not intractable, without status epilepticus: Secondary | ICD-10-CM | POA: Diagnosis not present

## 2022-08-05 DIAGNOSIS — F70 Mild intellectual disabilities: Secondary | ICD-10-CM | POA: Diagnosis not present

## 2022-08-05 NOTE — Patient Instructions (Signed)
Continue with Lamictal 250 mg twice daily Continue with Keppra 250 mg twice daily Continue other medication Follow up in 1 year or sooner if worse Information about VNS given to parents.

## 2022-08-05 NOTE — Progress Notes (Signed)
Reason for visit: Intractable seizures  Ariel Clark is an 21 y.o. female  INTERVAL HISTORY 08/05/2022:  Ariel Clark presents today for follow-up, she is accompanied by her mother.  Last visit was in March and at that time she was complaining of leg swelling, dizziness and tremor and I have decreased the Lamictal from 250 to 200 mg BID.  Unfortunately she had a subsequent seizure and lamotrigine has been increased back to 250 mg BID.  Since then she has been doing well on Lamictal 250 twice daily and Keppra 750 twice daily.  She did follow-up with dermatology regarding the leg swelling and it was due to the amantadine that she was taking.  Amantadine has been discontinued and patient is doing well, leg swelling resolved and the skin discoloration also improved.  Mother stated that now she is walking without any fear of fall.   INTERVAL HISTORY 01/28/2022:  Patient presents today for follow-up, she is accompanied by her parents.  Since last visit in December no additional seizures.  She is tolerating the medication well, currently she is on levetiracetam 750 mg twice daily and lamotrigine 250 mg twice daily.  Parents reported that usually at the end of the day she does complain of generalized weakness, feeling tired and not wanting to walk.  There is also report of patient feeling unsteady but no seizures.      INTERVAL HISTORY 10/29/2021 Patient present today for follow-up with parents, last visit was in August, at that time plan was to add levetiracetam to her regimen.  Currently she is taking levetiracetam 750 mg twice daily, denies any side effect from the medication, and she is also taking lamotrigine 250 mg twice daily.  Family report that her seizure frequency has decreased, her last seizure was in the beginning of November when she used to have 2 seizures per month.  No new concern.    Her seizure history started last year, she has tried Depakote, lamotrigine and levetiracetam.  Had side effects  from Depakote of dizziness and imbalance.     History of present illness:  Ariel Clark is a 21 year old right-handed white female with a history of premature birth, intellectual deficits, and intractable seizures.  The patient is having seizures about once every 2 to 3 weeks or so.  Some of the seizures will come out of sleep but most are during the daytime.  The mother indicates that she oftentimes will have a sensation of anxiety prior to the onset of the seizure.  Some of the seizures can be quite prolonged.  She may have incontinence of the bladder, she keeps her eyes open during the seizure event.  She will have bruising at times but no serious injury has occurred so far.  The patient is on lamotrigine currently, she did not tolerate the Depakote well because of increased tremors.  The patient has undergone a prolonged EEG overnight that was normal.  Routine EEG has also been normal in the past.  She returns to the office today for an evaluation.  She recently was increased on the Lamictal last week to a 250 mg twice daily dose, she tolerates this well.   Past Medical History:  Diagnosis Date   Medical history non-contributory    Mental disorder    Premature birth    74 weeks   Seizure disorder ([facility]) 07/05/2020   Seizures ([facility]) 04/2020    Past Surgical History:  Procedure Laterality Date   ADENOIDECTOMY, TONSILLECTOMY AND MYRINGOTOMY WITH TUBE PLACEMENT  EYE EXAMINATION UNDER ANESTHESIA W/ RETINAL CRYOTHERAPY AND RETINAL LASER      Family History  Problem Relation Age of Onset   Cancer Maternal Grandmother 41   Other Paternal Grandmother 69       Murdered    Social history:  reports that she has never smoked. She has never used smokeless tobacco. She reports that she does not drink alcohol and does not use drugs.    Allergies  Allergen Reactions   Amantadines Swelling    Numbness, swelling, leg discoloration    Medications:   Current Outpatient Medications:    ARISTADA  662 MG/2.4ML prefilled syringe, Inject 662 mg into the muscle once., Disp: , Rfl:    busPIRone (BUSPAR) 5 MG tablet, Take 10 mg by mouth 3 (three) times daily., Disp: , Rfl:    clonazePAM (KLONOPIN) 1 MG disintegrating tablet, TAKE 1 TABLET DAILY IF NEEDED FOR PROLONGED SEIZURE GREATER THAN 5 MINUTES OR 2 SEIZURES WITHIN 30 MINUTES, Disp: 15 tablet, Rfl: 3   docusate sodium (COLACE) 100 MG capsule, Take 100 mg by mouth daily. 1 tab in the morning., Disp: , Rfl:    lamoTRIgine (LAMICTAL) 100 MG tablet, Take 2.5 tablets (250 mg total) by mouth 2 (two) times daily. Take 2 tablets twice a day, Disp: 450 tablet, Rfl: 3   levETIRAcetam (KEPPRA) 750 MG tablet, Take 1 tablet (750 mg total) by mouth 2 (two) times daily., Disp: 180 tablet, Rfl: 1   LORazepam (ATIVAN) 1 MG tablet, Take 1 mg by mouth as needed., Disp: , Rfl:    Melatonin 10 MG TABS, Take 10 mg by mouth at bedtime as needed (Sleep). 1 tab at bedtime, Disp: , Rfl:    QUEtiapine (SEROQUEL) 50 MG tablet, Take 100 mg by mouth at bedtime., Disp: , Rfl:    ROS:  Out of a complete 14 system review of symptoms, the patient complains only of the following symptoms, and all other reviewed systems are negative.  Seizures Anxiety  Blood pressure 114/78, pulse 99, height 5' (1.524 m), weight 112 lb (50.8 kg).  Physical Exam  General: The patient is alert and cooperative at the time of the examination.  Skin: No significant peripheral edema is noted.   Neurologic Exam  Mental status: The patient is alert and oriented x 3 at the time of the examination. The patient has apparent normal recent and remote memory, with an apparently normal attention span and concentration ability. Naming, repetition intact.  Cranial nerves: Facial symmetry is present. Speech is normal, no aphasia or dysarthria is noted. Extraocular movements are full. Visual fields are full.  Motor: The patient has good strength in all 4 extremities.  Sensory examination: Soft  touch sensation is symmetric on the face, arms, and legs.  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  Coordination: The patient has good finger-nose-finger and heel-to-shin bilaterally.  Gait and station: The patient has a normal gait.   Reflexes: Deep tendon reflexes are symmetric.   Overnight EEG 05/06/21:  IMPRESSION: This study is within normal limits. No seizures or epileptiform discharges were seen throughout the recording.   Assessment/Plan:  1.  Intractable seizures  Doing well since being back on lamotrigine 250 mg twice daily and levetiracetam 750 mg twice daily, no seizures since being back on lamotrigine 250 mg twice daily on 02/16/22.  At this time we will continue same medications, I have also discussed VNS therapy with parents.  Continue other medications and follow-up in 1 year or sooner  if worse.   Patient Instructions  Continue with Lamictal 250 mg twice daily Continue with Keppra 250 mg twice daily Continue other medication Follow up in 1 year or sooner if worse Information about VNS given to parents.   I have spent a total of 30 minutes dedicated to this patient today, preparing to see patient, performing a medically appropriate examination and evaluation, ordering tests and/or medications and procedures, and counseling and educating the patient/family/caregiver; independently interpreting result and communicating results to the family/patient/caregiver; and documenting clinical information in the electronic medical record.   Alric Ran, MD 08/05/2022 9:36 AM  [facility] [address] [place], [place] [address]  Phone [phone] Fax [phone]

## 2022-08-26 ENCOUNTER — Other Ambulatory Visit: Payer: Self-pay | Admitting: Neurology

## 2023-01-06 NOTE — H&P (Signed)
Ariel Clark is a 22 y.o. female, G: 0 with a cognitive impairment and excessive menstrual flow, presents for insertion of a Mirena IUD for management.The patient has a 7 day menstrual flow with cramping that is relieved with Aleve and heating pad. Due to her cognitive impairment however,  she often will not inform her family that her period had begun nor when her pad needs to be changed which causes significant soiling of clothing, linen and surrounding areas. Options for management were reviewed with the patient's parents and given her cognitive impairment it was concluded that the Mirena IUD would be the best option. A pelvic ultrasound on 12/23/22 revealed a  uterus (volume-60.10 cc)length 8.16 cm x width 4.48 cm x height 3.14 cm; endometrium-12.80 mm and normal ovaries. Given the patient's anatomy is adequate for a Mirena insertion and consent was given to proceed with its placement.    Past Medical History  OB History: G: 0  GYN History: menarche:22 YO     LMP: 01/08/2023    Contracepton abstinence;    Medical History: Physical Disability, Borderline Schizophrenia, Cognitive Impairment, Seizure Disorder and Vitamin D Deficiency  Surgical History: Eye Surgery (retinal laser and cryotherapy) at birth along with blood transfusion (3 months premature), Tympanostomy with Tube Insertion  and Tonsillectomy/Adenoidectomy   Family History: Hypertension,  Ovarian Cancer, Thyroid Cancer, Lung Cancer, COPD and Colon Polyps.  Social History:   Single and resides in a group home; No alcohol or tobacco use:   Medications:  Aristada 662 mg /2.4 mL IM monthly Buspirone 15 mg daily Vitamin D 50,000 IU weekly Furosemide 20 mg prn-leg swelling Lamotrigine 100 mg  21/5 tablets bid Lorazepam 1 mg prn-anxiety Levitrecetam  750 mg bid Quetiapine 50 mg 1-2 qhs    Allergies  Allergen Reactions   Amantadines Swelling    Numbness, swelling, leg discoloration    ROS: Denies headache, vision changes, nasal  congestion, dysphagia, tinnitus, dizziness, hoarseness, cough,  chest pain, shortness of breath, nausea, vomiting, diarrhea,constipation,  urinary frequency, urgency  dysuria, hematuria, vaginitis symptoms, pelvic pain, swelling of joints,easy bruising,  myalgias, arthralgias, skin rashes, unexplained weight loss and except as is mentioned in the history of present illness, patient's review of systems is otherwise negative.    Physical Exam    There were no vitals taken for this visit.  Neck: supple without masses or thyromegaly Lungs: clear to auscultation Heart: regular rate and rhythm Abdomen: soft, non-tender and no organomegaly Pelvic:  Extremities:     Assesment: Abnormal Uterine Bleeding                      Cognitive Impairment   Disposition:  A discussion was held with patient regarding the indication for her procedure(s) along with the risks, which include but are not limited to: reaction to anesthesia, damage to adjacent organs, device perforation/expulsion, infection and excessive bleeding. The patient's guardians verbalized understanding of these risks and have consented to proceed with Insertion of a Mirena IUD with possible Hymenotomy at [facility] on January 21, 2023.   CSN# [id number]   Ariel Jastrzebski J. Florene Glen, PA-C  for Dr. Dede Query. Rivard

## 2023-01-18 ENCOUNTER — Other Ambulatory Visit: Payer: Self-pay

## 2023-01-18 ENCOUNTER — Encounter: Payer: Self-pay | Admitting: Neurology

## 2023-01-18 MED ORDER — LEVETIRACETAM 750 MG PO TABS: 750.0000 mg | ORAL_TABLET | Freq: Two times a day (BID) | ORAL | 1 refills | Status: DC

## 2023-01-18 MED ORDER — LAMOTRIGINE 100 MG PO TABS: 250.0000 mg | ORAL_TABLET | Freq: Two times a day (BID) | ORAL | 1 refills | Status: DC

## 2023-01-26 ENCOUNTER — Other Ambulatory Visit: Payer: Self-pay | Admitting: Neurology

## 2023-01-28 ENCOUNTER — Telehealth: Payer: Self-pay | Admitting: Neurology

## 2023-01-28 NOTE — Telephone Encounter (Signed)
I called [facility].  I spoke with Thedore Mins.  Per Dr. Jabier Mutton notes, patient should be taking Lamictal 250 mg twice daily.

## 2023-01-28 NOTE — Telephone Encounter (Signed)
Grace(pharmacy tech)[facility] [employer] [phone] has questions re: pt's lamoTRIgine (LAMICTAL) 100 MG tablet, she is asking for a call back

## 2023-02-16 ENCOUNTER — Encounter (HOSPITAL_BASED_OUTPATIENT_CLINIC_OR_DEPARTMENT_OTHER): Payer: Self-pay | Admitting: Obstetrics and Gynecology

## 2023-02-16 NOTE — Progress Notes (Addendum)
Addendum:  Spoke w/ Henreitta Leber PA via phone to verify if T&S needed.  Per [place] not needed may d/c order in epic and ok for pt not to drink ensure drink.  Spoke w/ via phone for pre-op interview--- pt's fathe, jacob Lab needs dos----  urine preg (?t&s)             Lab results------ no COVID test -----patient states asymptomatic no test needed Arrive at ------- 0745 0n 02-25-2023 NPO after MN NO Solid Food.  Clear liquids from MN until--- 0845 Med rec completed Medications to take morning of surgery ----- buspar, lamictal, keppra, , ativan if needed Diabetic medication ----- n/a Patient instructed no nail polish to be worn day of surgery Patient instructed to bring photo id and insurance card day of surgery Patient aware to have Driver (ride ) / caregiver    for 24 hours after surgery -- both parents Patient Special Instructions -----  n/a Pre-Op special Istructions -----  pt has mild intellectual disability/ schizoaffective disorder w/ bipolar/ seizure disorder/  both parent have legal guardianship (document scanned in epic, printed copy for chart).   Patient verbalized understanding of instructions that were given at this phone interview. Patient denies shortness of breath, chest pain, fever, cough at this phone interview.  Pt plan of care dos:  calm and quiet surroundings.  Father stated pt does handles getting IV's ok but is hard stick due to small veins, previously ultrasound was needed.  Having both parents in pre-op will help pt and post-op mother will be the most helpful.

## 2023-02-19 ENCOUNTER — Other Ambulatory Visit: Payer: Self-pay | Admitting: Neurology

## 2023-02-25 ENCOUNTER — Ambulatory Visit (HOSPITAL_BASED_OUTPATIENT_CLINIC_OR_DEPARTMENT_OTHER): Payer: Medicare Other | Admitting: Anesthesiology

## 2023-02-25 ENCOUNTER — Encounter (HOSPITAL_BASED_OUTPATIENT_CLINIC_OR_DEPARTMENT_OTHER): Payer: Self-pay | Admitting: Obstetrics and Gynecology

## 2023-02-25 ENCOUNTER — Other Ambulatory Visit (HOSPITAL_COMMUNITY): Admission: RE | Admit: 2023-02-25 | Payer: Medicare Other | Source: Ambulatory Visit

## 2023-02-25 ENCOUNTER — Other Ambulatory Visit: Payer: Self-pay

## 2023-02-25 ENCOUNTER — Ambulatory Visit (HOSPITAL_BASED_OUTPATIENT_CLINIC_OR_DEPARTMENT_OTHER)
Admission: RE | Admit: 2023-02-25 | Discharge: 2023-02-25 | Disposition: A | Discharge status: Home / Self Care | Payer: Medicare Other | Attending: Obstetrics and Gynecology | Admitting: Obstetrics and Gynecology

## 2023-02-25 ENCOUNTER — Encounter (HOSPITAL_BASED_OUTPATIENT_CLINIC_OR_DEPARTMENT_OTHER)
Admission: RE | Disposition: A | Discharge status: Home / Self Care | Payer: Self-pay | Source: Home / Self Care | Attending: Obstetrics and Gynecology

## 2023-02-25 DIAGNOSIS — F209 Schizophrenia, unspecified: Secondary | ICD-10-CM

## 2023-02-25 DIAGNOSIS — N920 Excessive and frequent menstruation with regular cycle: Secondary | ICD-10-CM | POA: Diagnosis not present

## 2023-02-25 DIAGNOSIS — N939 Abnormal uterine and vaginal bleeding, unspecified: Secondary | ICD-10-CM | POA: Diagnosis not present

## 2023-02-25 DIAGNOSIS — G40909 Epilepsy, unspecified, not intractable, without status epilepticus: Secondary | ICD-10-CM | POA: Insufficient documentation

## 2023-02-25 DIAGNOSIS — F70 Mild intellectual disabilities: Secondary | ICD-10-CM

## 2023-02-25 DIAGNOSIS — Z124 Encounter for screening for malignant neoplasm of cervix: Secondary | ICD-10-CM | POA: Diagnosis not present

## 2023-02-25 DIAGNOSIS — F319 Bipolar disorder, unspecified: Secondary | ICD-10-CM

## 2023-02-25 DIAGNOSIS — G3184 Mild cognitive impairment, so stated: Secondary | ICD-10-CM | POA: Insufficient documentation

## 2023-02-25 DIAGNOSIS — R4189 Other symptoms and signs involving cognitive functions and awareness: Secondary | ICD-10-CM

## 2023-02-25 DIAGNOSIS — Z01818 Encounter for other preprocedural examination: Secondary | ICD-10-CM

## 2023-02-25 HISTORY — DX: Excessive and frequent menstruation with regular cycle: N92.0

## 2023-02-25 HISTORY — DX: Schizoaffective disorder, bipolar type: F25.0

## 2023-02-25 HISTORY — PX: INTRAUTERINE DEVICE (IUD) INSERTION: SHX5877

## 2023-02-25 HISTORY — DX: Mild intellectual disabilities: F70

## 2023-02-25 HISTORY — PX: HYMENOTOMY: SHX6580

## 2023-02-25 LAB — POCT PREGNANCY, URINE: Preg Test, Ur: NEGATIVE

## 2023-02-25 SURGERY — INSERTION, INTRAUTERINE DEVICE
Anesthesia: Monitor Anesthesia Care | Site: Vagina

## 2023-02-25 MED ORDER — 0.9 % SODIUM CHLORIDE (POUR BTL) OPTIME
TOPICAL | Status: DC | PRN
  Administered 2023-02-25: 500 mL

## 2023-02-25 MED ORDER — ONDANSETRON HCL 4 MG/2ML IJ SOLN
INTRAMUSCULAR | Status: DC | PRN
  Administered 2023-02-25: 4 mg via INTRAVENOUS

## 2023-02-25 MED ORDER — LIDOCAINE HCL (CARDIAC) PF 100 MG/5ML IV SOSY
PREFILLED_SYRINGE | INTRAVENOUS | Status: DC | PRN
  Administered 2023-02-25: 60 mg via INTRAVENOUS

## 2023-02-25 MED ORDER — MIDAZOLAM HCL 2 MG/2ML IJ SOLN
INTRAMUSCULAR | Status: AC
  Filled 2023-02-25: qty 2

## 2023-02-25 MED ORDER — PROPOFOL 10 MG/ML IV BOLUS
INTRAVENOUS | Status: AC
  Filled 2023-02-25: qty 20

## 2023-02-25 MED ORDER — LIDOCAINE HCL (PF) 2 % IJ SOLN
INTRAMUSCULAR | Status: AC
  Filled 2023-02-25: qty 5

## 2023-02-25 MED ORDER — LACTATED RINGERS IV SOLN: INTRAVENOUS | Status: DC

## 2023-02-25 MED ORDER — PROPOFOL 500 MG/50ML IV EMUL
INTRAVENOUS | Status: AC
  Filled 2023-02-25: qty 50

## 2023-02-25 MED ORDER — OXYCODONE HCL 5 MG/5ML PO SOLN: 5.0000 mg | Freq: Once | ORAL | Status: DC | PRN

## 2023-02-25 MED ORDER — CELECOXIB 200 MG PO CAPS
ORAL_CAPSULE | ORAL | Status: AC
  Filled 2023-02-25: qty 2

## 2023-02-25 MED ORDER — CELECOXIB 200 MG PO CAPS
400.0000 mg | ORAL_CAPSULE | ORAL | Status: AC
  Administered 2023-02-25: 400 mg via ORAL

## 2023-02-25 MED ORDER — IBUPROFEN 600 MG PO TABS: 600.0000 mg | ORAL_TABLET | Freq: Four times a day (QID) | ORAL | 0 refills | Status: AC | PRN

## 2023-02-25 MED ORDER — FENTANYL CITRATE (PF) 100 MCG/2ML IJ SOLN
INTRAMUSCULAR | Status: AC
  Filled 2023-02-25: qty 2

## 2023-02-25 MED ORDER — HYDROMORPHONE HCL 1 MG/ML IJ SOLN: 0.2500 mg | INTRAMUSCULAR | Status: DC | PRN

## 2023-02-25 MED ORDER — DEXMEDETOMIDINE HCL IN NACL 80 MCG/20ML IV SOLN
INTRAVENOUS | Status: DC | PRN
  Administered 2023-02-25: 8 ug via BUCCAL

## 2023-02-25 MED ORDER — OXYCODONE HCL 5 MG PO TABS: 5.0000 mg | ORAL_TABLET | Freq: Once | ORAL | Status: DC | PRN

## 2023-02-25 MED ORDER — ACETAMINOPHEN 500 MG PO TABS: 1000.0000 mg | ORAL_TABLET | ORAL | Status: DC

## 2023-02-25 MED ORDER — CHLOROPROCAINE HCL 1 % IJ SOLN
INTRAMUSCULAR | Status: DC | PRN
  Administered 2023-02-25: 8 mL

## 2023-02-25 MED ORDER — SILVER NITRATE-POT NITRATE 75-25 % EX MISC
CUTANEOUS | Status: DC | PRN
  Administered 2023-02-25: 2

## 2023-02-25 MED ORDER — FENTANYL CITRATE (PF) 100 MCG/2ML IJ SOLN
INTRAMUSCULAR | Status: DC | PRN
  Administered 2023-02-25 (×2): 25 ug via INTRAVENOUS

## 2023-02-25 MED ORDER — GABAPENTIN 300 MG PO CAPS
ORAL_CAPSULE | ORAL | Status: AC
  Filled 2023-02-25: qty 1

## 2023-02-25 MED ORDER — PROPOFOL 500 MG/50ML IV EMUL
INTRAVENOUS | Status: DC | PRN
  Administered 2023-02-25: 100 ug/kg/min via INTRAVENOUS

## 2023-02-25 MED ORDER — SCOPOLAMINE 1 MG/3DAYS TD PT72
MEDICATED_PATCH | TRANSDERMAL | Status: AC
  Filled 2023-02-25: qty 1

## 2023-02-25 MED ORDER — POVIDONE-IODINE 10 % EX SWAB: 2.0000 | Freq: Once | CUTANEOUS | Status: DC

## 2023-02-25 MED ORDER — ONDANSETRON HCL 4 MG/2ML IJ SOLN
INTRAMUSCULAR | Status: AC
  Filled 2023-02-25: qty 2

## 2023-02-25 MED ORDER — LEVONORGESTREL 20 MCG/DAY IU IUD
1.0000 | INTRAUTERINE_SYSTEM | INTRAUTERINE | Status: AC
  Administered 2023-02-25: 1 via INTRAUTERINE

## 2023-02-25 MED ORDER — PROMETHAZINE HCL 25 MG/ML IJ SOLN: 6.2500 mg | INTRAMUSCULAR | Status: DC | PRN

## 2023-02-25 MED ORDER — LEVONORGESTREL 20 MCG/DAY IU IUD
INTRAUTERINE_SYSTEM | INTRAUTERINE | Status: AC
  Filled 2023-02-25: qty 1

## 2023-02-25 MED ORDER — MIDAZOLAM HCL 5 MG/5ML IJ SOLN
INTRAMUSCULAR | Status: DC | PRN
  Administered 2023-02-25 (×2): 1 mg via INTRAVENOUS
  Administered 2023-02-25: 2 mg via INTRAVENOUS

## 2023-02-25 MED ORDER — ACETAMINOPHEN 500 MG PO TABS
ORAL_TABLET | ORAL | Status: AC
  Filled 2023-02-25: qty 2

## 2023-02-25 MED ORDER — SCOPOLAMINE 1 MG/3DAYS TD PT72
1.0000 | MEDICATED_PATCH | TRANSDERMAL | Status: DC
  Administered 2023-02-25: 1.5 mg via TRANSDERMAL

## 2023-02-25 MED ORDER — GABAPENTIN 300 MG PO CAPS: 300.0000 mg | ORAL_CAPSULE | ORAL | Status: DC

## 2023-02-25 MED ORDER — BACITRACIN-NEOMYCIN-POLYMYXIN OINTMENT TUBE
TOPICAL_OINTMENT | CUTANEOUS | Status: DC | PRN
  Administered 2023-02-25: 1 via TOPICAL

## 2023-02-25 SURGICAL SUPPLY — 25 items
BLADE SURG 10 STRL SS (BLADE) IMPLANT
BLADE SURG 15 STRL LF DISP TIS (BLADE) ×3 IMPLANT
BLADE SURG 15 STRL SS (BLADE) ×2
CATH ROBINSON RED A/P 16FR (CATHETERS) ×3 IMPLANT
DILATOR CANAL MILEX (MISCELLANEOUS) IMPLANT
DRSG TELFA 3X8 NADH STRL (GAUZE/BANDAGES/DRESSINGS) ×3 IMPLANT
GAUZE 4X4 16PLY ~~LOC~~+RFID DBL (SPONGE) ×3 IMPLANT
GLOVE BIOGEL PI IND STRL 7.0 (GLOVE) ×9 IMPLANT
GLOVE ECLIPSE 6.5 STRL STRAW (GLOVE) ×3 IMPLANT
GOWN STRL REUS W/ TWL LRG LVL3 (GOWN DISPOSABLE) ×6 IMPLANT
GOWN STRL REUS W/TWL LRG LVL3 (GOWN DISPOSABLE) ×10 IMPLANT
KIT TURNOVER CYSTO (KITS) ×3 IMPLANT
Mirena Levonorgestrel-releasing intrauterine devic IMPLANT
NDL HYPO 22X1.5 SAFETY MO (MISCELLANEOUS) ×3 IMPLANT
NEEDLE HYPO 22X1.5 SAFETY MO (MISCELLANEOUS) ×2 IMPLANT
NS IRRIG 1000ML POUR BTL (IV SOLUTION) ×3 IMPLANT
PACK VAGINAL MINOR WOMEN LF (CUSTOM PROCEDURE TRAY) ×3 IMPLANT
PAD OB MATERNITY 4.3X12.25 (PERSONAL CARE ITEMS) ×3 IMPLANT
SLEEVE SCD COMPRESS KNEE MED (STOCKING) ×3 IMPLANT
SPIKE FLUID TRANSFER (MISCELLANEOUS) ×3 IMPLANT
SUT MNCRL AB 4-0 PS2 18 (SUTURE) IMPLANT
SUT VIC AB 3-0 SH 27 (SUTURE) ×2
SUT VIC AB 3-0 SH 27XBRD (SUTURE) IMPLANT
TOWEL OR 17X24 6PK STRL BLUE (TOWEL DISPOSABLE) ×3 IMPLANT
UNDERPAD 30X36 HEAVY ABSORB (UNDERPADS AND DIAPERS) ×3 IMPLANT

## 2023-02-25 NOTE — Discharge Instructions (Addendum)
POST-OPERATIVE INSTRUCTIONS TO PATIENT  Call [facility] at [phone]  for excessive pain, bleeding or temperature greater than or equal to 100.4 degrees (orally).    No driving for 24 hours  Pain management:  Use Ibuprofen 600 mg with Acetaminophen 1000 mg every 6 hours for 5 days and then as needed. Use Colace 1-2 capsules per day as long as you are using pain medication to avoid constipation.       Diet: normal  Bathing: may shower the day of surgery  Return to Dr. Estanislado Pandy on 03/04/23 at 1:30 pm [address] for ultrasound and 2:00 pm [address] for visit with Dr Silverio Lay Rivard MD         No ibuprofen, Advil, Aleve, Motrin, ketorolac, meloxicam, naproxen, or other NSAIDS until after 2:30 pm today if needed.   Post Anesthesia Home Care Instructions  Activity: Get plenty of rest for the remainder of the day. A responsible individual must stay with you for 24 hours following the procedure.  For the next 24 hours, DO NOT: -Drive a car -Advertising copywriter -Drink alcoholic beverages -Take any medication unless instructed by your physician -Make any legal decisions or sign important papers.  Meals: Start with liquid foods such as gelatin or soup. Progress to regular foods as tolerated. Avoid greasy, spicy, heavy foods. If nausea and/or vomiting occur, drink only clear liquids until the nausea and/or vomiting subsides. Call your physician if vomiting continues.  Special Instructions/Symptoms: Your throat may feel dry or sore from the anesthesia or the breathing tube placed in your throat during surgery. If this causes discomfort, gargle with warm salt water. The discomfort should disappear within 24 hours.  If you had a scopolamine patch placed behind your ear for the management of post- operative nausea and/or vomiting:  1. The medication in the patch is effective for 72 hours, after which it should be removed.  Wrap patch in a tissue and discard in the trash.  Wash hands thoroughly with soap and water. 2. You may remove the patch earlier than 72 hours if you experience unpleasant side effects which may include dry mouth, dizziness or visual disturbances. 3. Avoid touching the patch. Wash your hands with soap and water after contact with the patch.

## 2023-02-25 NOTE — Op Note (Signed)
Preop diagnosis: abnormal uterine bleeding, cognitive impairment  Postop diagnosis: same  Anesthesia: IV sedation  Anesthesiologist: Dr. Hyacinth Meeker  Procedure: pelvic exam under anesthesia, hymenotomy, collection of pap smear, insertion of Mirena IUD  Surgeon: Dr. Dois Davenport Wesam Gearhart  Procedure: After being informed of the planned procedure with possible complications including bleeding, infection and uterine perforation, informed consent was obtained and patient was taken to or #5.  She was given IV sedation anesthesia without complication. She was placed in a dorsal decubitus position, prepped and draped in the sterile fashion. Pelvic exam reveals anteverted uterus with normal adnexa x 2.  The hymenal ring is infiltrated with 8 cc of Nesacaine 1% to allow for a small vertical hymenotomy. A small speculum is inserted in the vagina. A pap smear was collected.The cervix was grasped with a tenaculum forcep placed on the anterior lip.  Uterus is sounded at 6.5. Mirena IUD is inserted per protocol.  Interrupted 3-0 Vicryl sutures are placed for hemostasis of the hymenal ring. Neosporin ointment is applied.  Instrument and sponge count is complete x2. Estimated blood loss is 5 cc.  The procedure is very well tolerated by the patient who is taken to recovery room in a well and stable condition.  Specimen: Cervical cytology sent to pathology.

## 2023-02-25 NOTE — Anesthesia Preprocedure Evaluation (Signed)
Anesthesia Evaluation  Patient identified by MRN, date of birth, ID band Patient awake    Reviewed: Allergy & Precautions, H&P , NPO status , Patient's Chart, lab work & pertinent test results  Airway Mallampati: II  TM Distance: >3 FB Neck ROM: Full    Dental no notable dental hx.    Pulmonary neg pulmonary ROS   Pulmonary exam normal breath sounds clear to auscultation       Cardiovascular negative cardio ROS Normal cardiovascular exam Rhythm:Regular Rate:Normal     Neuro/Psych Seizures -,    Depression Bipolar Disorder Schizophrenia   negative psych ROS   GI/Hepatic negative GI ROS, Neg liver ROS,,,  Endo/Other  negative endocrine ROS    Renal/GU negative Renal ROS  negative genitourinary   Musculoskeletal negative musculoskeletal ROS (+)    Abdominal   Peds negative pediatric ROS (+)  Hematology negative hematology ROS (+)   Anesthesia Other Findings   Reproductive/Obstetrics negative OB ROS                             Anesthesia Physical Anesthesia Plan  ASA: 3  Anesthesia Plan: MAC   Post-op Pain Management: Celebrex PO (pre-op)* and Minimal or no pain anticipated   Induction: Intravenous  PONV Risk Score and Plan: 2 and Ondansetron, Midazolam and Treatment may vary due to age or medical condition  Airway Management Planned: Mask  Additional Equipment:   Intra-op Plan:   Post-operative Plan:   Informed Consent: I have reviewed the patients History and Physical, chart, labs and discussed the procedure including the risks, benefits and alternatives for the proposed anesthesia with the patient or authorized representative who has indicated his/her understanding and acceptance.     Dental advisory given  Plan Discussed with: CRNA  Anesthesia Plan Comments:        Anesthesia Quick Evaluation

## 2023-02-25 NOTE — Interval H&P Note (Signed)
History and Physical Interval Note:  02/25/2023 9:07 AM  Ariel Clark  has presented today for surgery, with the diagnosis of EXCESSIVE MENSTRUAL FLOW COGNITIVE IMPAIRMENT.  The various methods of treatment have been discussed with the patient and family. After consideration of risks, benefits and other options for treatment, the patient has consented to  Procedure(s) with comments: INTRAUTERINE DEVICE (IUD) INSERTION (N/A) - IV SEDATION POSSIBLE HYMENOTOMY (N/A) - IV SEDATION as a surgical intervention.  The patient's history has been reviewed, patient examined, no change in status, stable for surgery.  I have reviewed the patient's chart and labs.  Questions were answered to the patient's satisfaction.     Dois Davenport A Damarea Merkel

## 2023-02-25 NOTE — Anesthesia Postprocedure Evaluation (Signed)
Anesthesia Post Note  Patient: Ariel Clark  Procedure(s) Performed: INTRAUTERINE DEVICE (IUD) INSERTION HYMENOTOMY (Vagina ) EXAM UNDER ANESTHESIA; PAP SMEAR (Vagina )     Patient location during evaluation: PACU Anesthesia Type: MAC Level of consciousness: awake and alert Pain management: pain level controlled Vital Signs Assessment: post-procedure vital signs reviewed and stable Respiratory status: spontaneous breathing, nonlabored ventilation and respiratory function stable Cardiovascular status: blood pressure returned to baseline and stable Postop Assessment: no apparent nausea or vomiting Anesthetic complications: no   No notable events documented.  Last Vitals:  Vitals:   02/25/23 1045 02/25/23 1100  BP: (!) 135/95 (!) 127/98  Pulse: 88 93  Resp: 16 17  Temp:    SpO2: 99% 98%    Last Pain:  Vitals:   02/25/23 1045  TempSrc:   PainSc: 0-No pain                 Lowella Curb

## 2023-02-25 NOTE — H&P (Signed)
  Ariel Clark is a 22 y.o. female, G: 0 with a cognitive impairment and excessive menstrual flow, presents for insertion of a Mirena IUD for management.The patient has a 7 day menstrual flow with cramping that is relieved with Aleve and heating pad. Due to her cognitive impairment however,  she often will not inform her family that her period had begun nor when her pad needs to be changed which causes significant soiling of clothing, linen and surrounding areas. Options for management were reviewed with the patient's parents and given her cognitive impairment it was concluded that the Mirena IUD would be the best option. A pelvic ultrasound on 12/23/22 revealed a  uterus (volume-60.10 cc)length 8.16 cm x width 4.48 cm x height 3.14 cm; endometrium-12.80 mm and normal ovaries. Given the patient's anatomy is adequate for a Mirena insertion and consent was given to proceed with its placement.     Past Medical History   OB History: G: 0   GYN History: menarche:22 YO     LMP: 01/08/2023    Contracepton abstinence;     Medical History: Physical Disability, Borderline Schizophrenia, Cognitive Impairment, Seizure Disorder and Vitamin D Deficiency   Surgical History: Eye Surgery (retinal laser and cryotherapy) at birth along with blood transfusion (3 months premature), Tympanostomy with Tube Insertion  and Tonsillectomy/Adenoidectomy     Family History: Hypertension,  Ovarian Cancer, Thyroid Cancer, Lung Cancer, COPD and Colon Polyps.   Social History:   Single and resides in a group home; No alcohol or tobacco use:     Medications:  Aristada 662 mg /2.4 mL IM monthly Buspirone 15 mg daily Vitamin D 50,000 IU weekly Furosemide 20 mg prn-leg swelling Lamotrigine 100 mg  21/5 tablets bid Lorazepam 1 mg prn-anxiety Levitrecetam  750 mg bid Quetiapine 50 mg 1-2 qhs            Allergies  Allergen Reactions   Amantadines Swelling      Numbness, swelling, leg discoloration      ROS: Denies  headache, vision changes, nasal congestion, dysphagia, tinnitus, dizziness, hoarseness, cough,  chest pain, shortness of breath, nausea, vomiting, diarrhea,constipation,  urinary frequency, urgency  dysuria, hematuria, vaginitis symptoms, pelvic pain, swelling of joints,easy bruising,  myalgias, arthralgias, skin rashes, unexplained weight loss and except as is mentioned in the history of present illness, patient's review of systems is otherwise negative.       Physical Exam    There were no vitals taken for this visit.   Neck: supple without masses or thyromegaly Lungs: clear to auscultation Heart: regular rate and rhythm Abdomen: soft, non-tender and no organomegaly Pelvic:  Extremities:       Assesment: Abnormal Uterine Bleeding                      Cognitive Impairment     Disposition:  A discussion was held with patient regarding the indication for her procedure(s) along with the risks, which include but are not limited to: reaction to anesthesia, damage to adjacent organs, device perforation/expulsion, infection and excessive bleeding. The patient's guardians verbalized understanding of these risks and have consented to proceed with Insertion of a Mirena IUD with possible Hymenotomy at [facility] on February 25, 2023.

## 2023-02-25 NOTE — Interval H&P Note (Signed)
History and Physical Interval Note:  02/25/2023 8:54 AM  Ariel Clark  has presented today for surgery, with the diagnosis of EXCESSIVE MENSTRUAL FLOW COGNITIVE IMPAIRMENT.  The various methods of treatment have been discussed with the patient and family. After consideration of risks, benefits and other options for treatment, the patient has consented to  Procedure(s) with comments: INTRAUTERINE DEVICE (IUD) INSERTION (N/A) - IV SEDATION POSSIBLE HYMENOTOMY (N/A) - IV SEDATION as a surgical intervention.  The patient's history has been reviewed, patient examined, no change in status, stable for surgery.  I have reviewed the patient's chart and labs.  Questions were answered to the patient's satisfaction.     Dois Davenport A Jenean Escandon

## 2023-02-25 NOTE — Anesthesia Procedure Notes (Signed)
Procedure Name: MAC Date/Time: 02/25/2023 9:22 AM  Performed by: Jessica Priest, CRNAPre-anesthesia Checklist: Timeout performed, Patient being monitored, Suction available, Emergency Drugs available and Patient identified Patient Re-evaluated:Patient Re-evaluated prior to induction Oxygen Delivery Method: Simple face mask Preoxygenation: Pre-oxygenation with 100% oxygen Induction Type: IV induction Placement Confirmation: breath sounds checked- equal and bilateral, CO2 detector and positive ETCO2

## 2023-02-25 NOTE — Transfer of Care (Signed)
Immediate Anesthesia Transfer of Care Note  Patient: Ariel Clark  Procedure(s) Performed: Procedure(s) (LRB): INTRAUTERINE DEVICE (IUD) INSERTION (N/A) HYMENOTOMY (N/A) EXAM UNDER ANESTHESIA; PAP SMEAR (N/A)  Patient Location: PACU  Anesthesia Type: MAC  Level of Consciousness: awake, sedated, patient cooperative and responds to stimulation  Airway & Oxygen Therapy: Patient Spontanous Breathing and Patient connected to [place] oxygen  Post-op Assessment: Report given to PACU RN, Post -op Vital signs reviewed and stable and Patient moving all extremities  Post vital signs: Reviewed and stable  Complications: No apparent anesthesia complications

## 2023-02-26 ENCOUNTER — Encounter (HOSPITAL_BASED_OUTPATIENT_CLINIC_OR_DEPARTMENT_OTHER): Payer: Self-pay | Admitting: Obstetrics and Gynecology

## 2023-03-01 LAB — CYTOLOGY - PAP: Diagnosis: NEGATIVE

## 2023-04-15 ENCOUNTER — Encounter: Payer: Self-pay | Admitting: Neurology

## 2023-04-15 MED ORDER — LAMOTRIGINE 100 MG PO TABS: 250.0000 mg | ORAL_TABLET | Freq: Two times a day (BID) | ORAL | 1 refills | Status: DC

## 2023-04-15 MED ORDER — LEVETIRACETAM 750 MG PO TABS: 750.0000 mg | ORAL_TABLET | Freq: Two times a day (BID) | ORAL | 1 refills | Status: DC

## 2023-04-28 ENCOUNTER — Other Ambulatory Visit: Payer: Self-pay

## 2023-04-28 MED ORDER — LAMOTRIGINE 100 MG PO TABS: 250.0000 mg | ORAL_TABLET | Freq: Two times a day (BID) | ORAL | 1 refills | Status: DC

## 2023-07-20 ENCOUNTER — Encounter: Payer: Self-pay | Admitting: Neurology

## 2023-07-20 ENCOUNTER — Other Ambulatory Visit: Payer: Self-pay

## 2023-07-20 MED ORDER — LAMOTRIGINE 100 MG PO TABS: 250.0000 mg | ORAL_TABLET | Freq: Two times a day (BID) | ORAL | 1 refills | Status: DC

## 2023-07-20 NOTE — Telephone Encounter (Signed)
Call to Cecil Cobbs who reports after 4 days he has noticed that the pill pack has only 100 mg lamotrigine BID. He states he call the pharmacy and script was sent in by Starling Manns, PA and he wasn't aware of a decrease in the dose. Dad states he then called the provider office and they have no record of medication being sent in. Discussed the above with Dr. Teresa Coombs and per verbal order, patient should be taking lamotrigine 100 mg 2.5 tablet twice daily. I call pillpack pharmacy at [facility] and spoke with Karilyn Cota, Pharmacy tech. She states on 06/09/23, [place], [place] sent in script for 100 mg lamotrigine twice daily and pharmacy reach back out and they confirmed that decreased in dosage was correct. Dad concern patient may have seizures due to unknowingly have less seizures medications than prescribed. Carlyle Lipa, pharm d tech, that new prescription would be sent over to replace Joe hughes 100 mg script BID. Katie verbalized understanding

## 2023-08-02 ENCOUNTER — Ambulatory Visit: Payer: Medicare Other | Admitting: Neurology

## 2023-08-18 ENCOUNTER — Ambulatory Visit (INDEPENDENT_AMBULATORY_CARE_PROVIDER_SITE_OTHER): Payer: Medicare Other | Admitting: Neurology

## 2023-08-18 ENCOUNTER — Encounter: Payer: Self-pay | Admitting: Neurology

## 2023-08-18 VITALS — BP 128/78 | Ht 60.0 in | Wt 155.0 lb

## 2023-08-18 DIAGNOSIS — G40909 Epilepsy, unspecified, not intractable, without status epilepticus: Secondary | ICD-10-CM

## 2023-08-18 DIAGNOSIS — E559 Vitamin D deficiency, unspecified: Secondary | ICD-10-CM | POA: Diagnosis not present

## 2023-08-18 DIAGNOSIS — Z5181 Encounter for therapeutic drug level monitoring: Secondary | ICD-10-CM

## 2023-08-18 DIAGNOSIS — F70 Mild intellectual disabilities: Secondary | ICD-10-CM

## 2023-08-18 NOTE — Progress Notes (Signed)
Reason for visit: Intractable seizures  Ariel Clark is an 22 y.o. female  INTERVAL HISTORY 08/18/2023: Ariel Clark presents today for follow-up, she is accompanied by her mother and caregiver.  Last visit was in September last year and since then she has been doing well, she is currently on the lamotrigine 250 mg twice daily and Keppra 750 mg twice daily.  She denies any seizure or seizure-like activity and denies any side effect of the medications.  Overall she is doing well no other complaint no other questions.  INTERVAL HISTORY 08/05/2022:  Ariel Clark presents today for follow-up, she is accompanied by her mother.  Last visit was in March and at that time she was complaining of leg swelling, dizziness and tremor and I have decreased the Lamictal from 250 to 200 mg BID.  Unfortunately she had a subsequent seizure and lamotrigine has been increased back to 250 mg BID.  Since then she has been doing well on Lamictal 250 twice daily and Keppra 750 twice daily.  She did follow-up with dermatology regarding the leg swelling and it was due to the amantadine that she was taking.  Amantadine has been discontinued and patient is doing well, leg swelling resolved and the skin discoloration also improved.  Mother stated that now she is walking without any fear of fall.   INTERVAL HISTORY 01/28/2022:  Patient presents today for follow-up, she is accompanied by her parents.  Since last visit in December no additional seizures.  She is tolerating the medication well, currently she is on levetiracetam 750 mg twice daily and lamotrigine 250 mg twice daily.  Parents reported that usually at the end of the day she does complain of generalized weakness, feeling tired and not wanting to walk.  There is also report of patient feeling unsteady but no seizures.      INTERVAL HISTORY 10/29/2021 Patient present today for follow-up with parents, last visit was in August, at that time plan was to add levetiracetam to her regimen.   Currently she is taking levetiracetam 750 mg twice daily, denies any side effect from the medication, and she is also taking lamotrigine 250 mg twice daily.  Family report that her seizure frequency has decreased, her last seizure was in the beginning of November when she used to have 2 seizures per month.  No new concern.    Her seizure history started last year, she has tried Depakote, lamotrigine and levetiracetam.  Had side effects from Depakote of dizziness and imbalance.     History of present illness:  Ariel Clark is a 22 year old right-handed white female with a history of premature birth, intellectual deficits, and intractable seizures.  The patient is having seizures about once every 2 to 3 weeks or so.  Some of the seizures will come out of sleep but most are during the daytime.  The mother indicates that she oftentimes will have a sensation of anxiety prior to the onset of the seizure.  Some of the seizures can be quite prolonged.  She may have incontinence of the bladder, she keeps her eyes open during the seizure event.  She will have bruising at times but no serious injury has occurred so far.  The patient is on lamotrigine currently, she did not tolerate the Depakote well because of increased tremors.  The patient has undergone a prolonged EEG overnight that was normal.  Routine EEG has also been normal in the past.  She returns to the office today for an evaluation.  She  recently was increased on the Lamictal last week to a 250 mg twice daily dose, she tolerates this well.   Past Medical History:  Diagnosis Date   Menstrual flow excessive    Mild intellectual disability    Premature birth    77 weeks   Schizoaffective disorder, bipolar type (HCC)    Seizure disorder (HCC) 04/2020   (02-16-2023  per pt father last seizure 08/ 2023) //  neurologist--- dr a. Teresa Coombs;   first seizure 05-14-2020 ED visit in epic;  intractable seizure    Past Surgical History:  Procedure Laterality Date    ADENOIDECTOMY AND MYRINGOTOMY WITH TUBE PLACEMENT Bilateral 2008   @n  [facility]   COLONOSCOPY WITH ESOPHAGOGASTRODUODENOSCOPY (EGD)  07/24/2020   @ [facility] w/ anesthesia   EYE EXAMINATION UNDER ANESTHESIA W/ RETINAL CRYOTHERAPY AND RETINAL LASER  2002   HYMENOTOMY N/A 02/25/2023   Procedure: HYMENOTOMY;  Surgeon: Silverio Lay, MD;  Location: [facility];  Service: Gynecology;  Laterality: N/A;  IV SEDATION   INTRAUTERINE DEVICE (IUD) INSERTION N/A 02/25/2023   Procedure: INTRAUTERINE DEVICE (IUD) INSERTION;  Surgeon: Silverio Lay, MD;  Location: [facility];  Service: Gynecology;  Laterality: N/A;  IV SEDATION   TONSILLECTOMY Bilateral 11/2007   @ [facility];   w/ tympanostomy tube placement bilateral   WISDOM TOOTH EXTRACTION  2022   general anesthesia in dental surgeon office    Family History  Problem Relation Age of Onset   Cancer Maternal Grandmother 29   Other Paternal Grandmother 36       Murdered    Social history:  reports that she has never smoked. She has never used smokeless tobacco. She reports that she does not drink alcohol and does not use drugs.    Allergies  Allergen Reactions   Amantadines Swelling    Numbness, swelling, leg discoloration    Medications:   Current Outpatient Medications:    ARISTADA 662 MG/2.4ML prefilled syringe, Inject 662 mg into the muscle once., Disp: , Rfl:    busPIRone (BUSPAR) 15 MG tablet, Take 15 mg by mouth 2 (two) times daily. 0800  and 2000, Disp: , Rfl:    Cholecalciferol (VITAMIN D3) 125 MCG (5000 UT) capsule, Take 5,000 Units by mouth at bedtime., Disp: , Rfl:    clonazePAM (KLONOPIN) 1 MG disintegrating tablet, TAKE 1 TABLET DAILY IF NEEDED FOR PROLONGED SEIZURE GREATER THAN 5 MINUTES OR 2 SEIZURES WITHIN 30 MINUTES (Patient taking differently: Take 1 mg by mouth as directed. TAKE 1 TABLET DAILY IF NEEDED FOR PROLONGED SEIZURE GREATER THAN 5 MINUTES OR 2 SEIZURES WITHIN 30 MINUTES), Disp: 15 tablet, Rfl:  3   docusate sodium (COLACE) 100 MG capsule, Take 100 mg by mouth daily. 1 tab in the morning., Disp: , Rfl:    ibuprofen (ADVIL) 600 MG tablet, Take 1 tablet (600 mg total) by mouth every 6 (six) hours as needed (Patient may take 1st dose today at 2:30 pm)., Disp: 30 tablet, Rfl: 0   lamoTRIgine (LAMICTAL) 100 MG tablet, Take 2.5 tablets (250 mg total) by mouth 2 (two) times daily. Take 2.5  tablets twice a day, Disp: 450 tablet, Rfl: 1   levETIRAcetam (KEPPRA) 750 MG tablet, Take 1 tablet (750 mg total) by mouth 2 (two) times daily., Disp: 180 tablet, Rfl: 1   QUEtiapine (SEROQUEL) 50 MG tablet, Take 100 mg by mouth at bedtime., Disp: , Rfl:    busPIRone (BUSPAR) 10 MG tablet, Take 10 mg by mouth daily before lunch. At  1400, Disp: , Rfl:    ROS:  Out of a complete 14 system review of symptoms, the patient complains only of the following symptoms, and all other reviewed systems are negative.  Seizures Anxiety  Blood pressure 128/78, height 5' (1.524 m), weight 155 lb (70.3 kg).  Physical Exam  General: The patient is alert and cooperative at the time of the examination.  Skin: No significant peripheral edema is noted.   Neurologic Exam  Mental status: The patient is alert and oriented x 3 at the time of the examination. The patient has apparent normal recent and remote memory, with an apparently normal attention span and concentration ability. Naming, repetition intact.  Cranial nerves: Facial symmetry is present. Speech is normal, no aphasia or dysarthria is noted. Extraocular movements are full. Visual fields are full.  Motor: The patient has good strength in all 4 extremities.  Sensory examination: Soft touch sensation is symmetric on the face, arms, and legs.  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  Coordination: The patient has good finger-nose-finger and heel-to-shin bilaterally.  Gait and station: The patient has a normal gait.   Reflexes: Deep  tendon reflexes are symmetric.   Overnight EEG 05/06/21:  IMPRESSION: This study is within normal limits. No seizures or epileptiform discharges were seen throughout the recording.   Assessment/Plan:  1.  Intractable seizures  Doing well on lamotrigine 250 mg twice daily and levetiracetam 750 mg twice daily. Last seizure November 2022.  At this time we will continue same medications.  Continue other medications and follow-up in 1 year or sooner if worse. If there are additional breakthrough seizures, will consider Cenobamate, vs. Clobazam vs. VNS therapy.   Patient Instructions  Continue with lamotrigine 250 mg twice daily Continue with levetiracetam 750 mg twice daily Will do blood work today Continue your other medications If you do have additional breakthrough seizure, will consider VNS therapy versus clobazam versus cenobamate.   Follow-up in 1 year or sooner if worse.     Windell Norfolk, MD 08/18/2023 1:21 PM  [facility] [address] [place], [place] [address]  Phone [phone] Fax [phone]

## 2023-08-18 NOTE — Patient Instructions (Addendum)
Continue with lamotrigine 250 mg twice daily Continue with levetiracetam 750 mg twice daily Will do blood work today Continue your other medications If you do have additional breakthrough seizure, will consider VNS therapy versus clobazam versus cenobamate.   Follow-up in 1 year or sooner if worse.

## 2023-08-20 LAB — BASIC METABOLIC PANEL
BUN/Creatinine Ratio: 14 (ref 9–23)
BUN: 12 mg/dL (ref 6–20)
CO2: 20 mmol/L (ref 20–29)
Calcium: 9.8 mg/dL (ref 8.7–10.2)
Chloride: 105 mmol/L (ref 96–106)
Creatinine, Ser: 0.88 mg/dL (ref 0.57–1.00)
Glucose: 96 mg/dL (ref 70–99)
Potassium: 4.4 mmol/L (ref 3.5–5.2)
Sodium: 141 mmol/L (ref 134–144)
eGFR: 95 mL/min/{1.73_m2} (ref >59)

## 2023-08-20 LAB — LAMOTRIGINE LEVEL: Lamotrigine Lvl: 6 ug/mL (ref 2.0–20.0)

## 2023-08-20 LAB — VITAMIN D 25 HYDROXY (VIT D DEFICIENCY, FRACTURES): Vit D, 25-Hydroxy: 48 ng/mL (ref 30.0–100.0)

## 2023-08-20 LAB — LEVETIRACETAM LEVEL: Levetiracetam Lvl: 11.9 ug/mL (ref 10.0–40.0)

## 2023-12-26 ENCOUNTER — Other Ambulatory Visit: Payer: Self-pay | Admitting: Neurology

## 2024-01-25 ENCOUNTER — Other Ambulatory Visit: Payer: Self-pay | Admitting: Neurology

## 2024-01-25 ENCOUNTER — Other Ambulatory Visit: Payer: Self-pay

## 2024-01-25 MED ORDER — LEVETIRACETAM 750 MG PO TABS: 750.0000 mg | ORAL_TABLET | Freq: Two times a day (BID) | ORAL | 1 refills | Status: DC

## 2024-03-25 ENCOUNTER — Other Ambulatory Visit: Payer: Self-pay | Admitting: Neurology

## 2024-06-15 ENCOUNTER — Encounter (HOSPITAL_COMMUNITY): Payer: Self-pay

## 2024-06-15 ENCOUNTER — Emergency Department (HOSPITAL_COMMUNITY)
Admission: EM | Admit: 2024-06-15 | Discharge: 2024-06-15 | Disposition: A | Discharge status: Home / Self Care | Attending: Student | Admitting: Student

## 2024-06-15 ENCOUNTER — Emergency Department (HOSPITAL_COMMUNITY)

## 2024-06-15 ENCOUNTER — Other Ambulatory Visit: Payer: Self-pay

## 2024-06-15 DIAGNOSIS — W108XXA Fall (on) (from) other stairs and steps, initial encounter: Secondary | ICD-10-CM | POA: Diagnosis not present

## 2024-06-15 DIAGNOSIS — S53025A Posterior dislocation of left radial head, initial encounter: Secondary | ICD-10-CM | POA: Insufficient documentation

## 2024-06-15 DIAGNOSIS — S53105A Unspecified dislocation of left ulnohumeral joint, initial encounter: Secondary | ICD-10-CM

## 2024-06-15 DIAGNOSIS — S59902A Unspecified injury of left elbow, initial encounter: Secondary | ICD-10-CM | POA: Diagnosis present

## 2024-06-15 MED ORDER — PROPOFOL 10 MG/ML IV BOLUS
INTRAVENOUS | Status: AC | PRN
  Administered 2024-06-15 (×2): 102 mg via INTRAVENOUS

## 2024-06-15 MED ORDER — MORPHINE SULFATE (PF) 4 MG/ML IV SOLN
4.0000 mg | Freq: Once | INTRAVENOUS | Status: DC
  Filled 2024-06-15: qty 1

## 2024-06-15 MED ORDER — LACTATED RINGERS IV BOLUS
1000.0000 mL | Freq: Once | INTRAVENOUS | Status: AC
  Administered 2024-06-15: 1000 mL via INTRAVENOUS

## 2024-06-15 MED ORDER — NAPROXEN 375 MG PO TABS: 375.0000 mg | ORAL_TABLET | Freq: Two times a day (BID) | ORAL | 0 refills | Status: DC

## 2024-06-15 MED ORDER — PROPOFOL 10 MG/ML IV BOLUS
1.0000 mg/kg | Freq: Once | INTRAVENOUS | Status: AC
  Administered 2024-06-15: 68 mg via INTRAVENOUS
  Filled 2024-06-15: qty 20

## 2024-06-15 MED ORDER — PHENYLEPHRINE 80 MCG/ML (10ML) SYRINGE FOR IV PUSH (FOR BLOOD PRESSURE SUPPORT)
80.0000 ug | PREFILLED_SYRINGE | Freq: Once | INTRAVENOUS | Status: DC | PRN
  Filled 2024-06-15: qty 10

## 2024-06-15 NOTE — Sedation Documentation (Signed)
 Ortho has finished splinting arm and sling applied.

## 2024-06-15 NOTE — Sedation Documentation (Signed)
 Education provided to patients

## 2024-06-15 NOTE — Progress Notes (Signed)
 Respiratory Therapist at [facility]  in room number WA22 during procedure.  Suction with [place] at [facility] set up and ready to use. Ambu bag at [facility] and ready to use.  Patient placed on ETCO2 Nasal Cannula at 2 LPM.  Vitals at conclusion of procedure:  ETCO2 39 mmHg HR 84 RR 16 SPO2 100%  Patient awake and able to verbalize name.

## 2024-06-15 NOTE — Progress Notes (Signed)
 Orthopedic Tech Progress Note Patient Details:  Ariel Clark September 13, 2001 [id number]  Ortho Devices Type of Ortho Device: Shoulder immobilizer, Long arm splint Ortho Device/Splint Location: LUE Ortho Device/Splint Interventions: Ordered, Application, Adjustment   Post Interventions Patient Tolerated: Well Instructions Provided: Adjustment of device, Care of device  Adine MARLA Blush 06/15/2024, 7:04 PM

## 2024-06-15 NOTE — Sedation Documentation (Signed)
 X-ray at bedside

## 2024-06-15 NOTE — ED Triage Notes (Signed)
 Pt was carrying groceries inside and fell injuring her left arm/elbow. Pt has a scrape to her right knee.

## 2024-06-15 NOTE — ED Provider Notes (Signed)
 [place] EMERGENCY DEPARTMENT AT [facility] Provider Note  CSN: [id number] Arrival date & time: 06/15/24 1734  Chief Complaint(s) Fall and Arm Injury  HPI [facility] Ariel Clark is a 23 y.o. female with PMH intellectual disability, schizophrenia, seizures who presents emerged department for evaluation of a fall with a left arm injury.  Patient was reportedly carrying groceries inside, fell off a staircase and landed on her left elbow.  Arrives with an obvious deformity to the left elbow and patient is endorsing distal numbness and paresthesia of the hand and forearm.  Denies head strike or loss of consciousness.  No blood thinner use.   Past Medical History Past Medical History:  Diagnosis Date   Menstrual flow excessive    Mild intellectual disability    Premature birth    18 weeks   Schizoaffective disorder, bipolar type (HCC)    Seizure disorder (HCC) 04/2020   (02-16-2023  per pt father last seizure 08/ 2023) //  neurologist--- dr a. gregg;   first seizure 05-14-2020 ED visit in epic;  intractable seizure   Patient Active Problem List   Diagnosis Date Noted   Seizure (HCC) 05/05/2021   Mental disorder    Premature birth    Seizure disorder (HCC) 07/05/2020   MDD (major depressive disorder), recurrent, severe, with psychosis (HCC) 03/22/2018   Auditory hallucinations 05/28/2015   Visual hallucinations 05/28/2015   Mild intellectual disability 05/28/2015   Home Medication(s) Prior to Admission medications   Medication Sig Start Date End Date Taking? Authorizing Provider  naproxen  (NAPROSYN ) 375 MG tablet Take 1 tablet (375 mg total) by mouth 2 (two) times daily. 06/15/24  Yes Shaylon Aden, MD  ARISTADA 662 MG/2.4ML prefilled syringe Inject 662 mg into the muscle once. 06/26/20   [provider]  busPIRone  (BUSPAR ) 15 MG tablet Take 15 mg by mouth 2 (two) times daily. 0800  and 2000    [provider]  Cholecalciferol (VITAMIN D3) 125 MCG (5000 UT)  capsule Take 5,000 Units by mouth at bedtime.    [provider]  docusate sodium  (COLACE) 100 MG capsule Take 100 mg by mouth daily. 1 tab in the morning.    [provider]  ibuprofen  (ADVIL ) 600 MG tablet Take 1 tablet (600 mg total) by mouth every 6 (six) hours as needed (Patient may take 1st dose today at 2:30 pm). 02/25/23   Darcel Pool, MD  lamoTRIgine  (LAMICTAL ) 100 MG tablet Take 2 and 1/2 tablets by mouth twice daily. Replacing lamotrigine  100 mg twice daily. 03/27/24   Camara, Amadou, MD  levETIRAcetam  (KEPPRA ) 750 MG tablet Take 1 tablet (750 mg total) by mouth 2 (two) times daily. 01/25/24   Camara, Amadou, MD  QUEtiapine  (SEROQUEL ) 50 MG tablet Take 100 mg by mouth at bedtime.    [provider]  Past Surgical History Past Surgical History:  Procedure Laterality Date   ADENOIDECTOMY AND MYRINGOTOMY WITH TUBE PLACEMENT Bilateral 2008   @n  [facility]   COLONOSCOPY WITH ESOPHAGOGASTRODUODENOSCOPY (EGD)  07/24/2020   @ [facility] w/ anesthesia   EYE EXAMINATION UNDER ANESTHESIA W/ RETINAL CRYOTHERAPY AND RETINAL LASER  2002   HYMENOTOMY N/A 02/25/2023   Procedure: HYMENOTOMY;  Surgeon: Darcel Pool, MD;  Location: [facility];  Service: Gynecology;  Laterality: N/A;  IV SEDATION   INTRAUTERINE DEVICE (IUD) INSERTION N/A 02/25/2023   Procedure: INTRAUTERINE DEVICE (IUD) INSERTION;  Surgeon: Darcel Pool, MD;  Location: [facility];  Service: Gynecology;  Laterality: N/A;  IV SEDATION   TONSILLECTOMY Bilateral 11/2007   @ [facility];   w/ tympanostomy tube placement bilateral   WISDOM TOOTH EXTRACTION  2022   general anesthesia in dental surgeon office   Family History Family History  Problem Relation Age of Onset   Cancer Maternal Grandmother 106   Other Paternal Grandmother 4       Murdered    Social  History Social History   Tobacco Use   Smoking status: Never   Smokeless tobacco: Never  Vaping Use   Vaping status: Never Used  Substance Use Topics   Alcohol use: Never    Alcohol/week: 0.0 standard drinks of alcohol   Drug use: Never   Allergies Amantadines  Review of Systems Review of Systems  Musculoskeletal:  Positive for arthralgias, joint swelling and myalgias.    Physical Exam Vital Signs  I have reviewed the triage vital signs BP 128/82 (BP Location: Right Arm)   Pulse 96   Temp 98 F (36.7 C) (Oral)   Resp 18   Wt 68 kg   SpO2 100%   BMI 29.29 kg/m   Physical Exam Vitals and nursing note reviewed.  Constitutional:      General: She is not in acute distress.    Appearance: She is well-developed.  HENT:     Head: Normocephalic and atraumatic.  Eyes:     Conjunctiva/sclera: Conjunctivae normal.  Cardiovascular:     Rate and Rhythm: Normal rate and regular rhythm.     Heart sounds: No murmur heard. Pulmonary:     Effort: Pulmonary effort is normal. No respiratory distress.     Breath sounds: Normal breath sounds.  Abdominal:     Palpations: Abdomen is soft.     Tenderness: There is no abdominal tenderness.  Musculoskeletal:        General: Swelling, tenderness, deformity and signs of injury present.     Cervical back: Neck supple.  Skin:    General: Skin is warm and dry.     Capillary Refill: Capillary refill takes less than 2 seconds.  Neurological:     Mental Status: She is alert.  Psychiatric:        Mood and Affect: Mood normal.     ED Results and Treatments Labs (all labs ordered are listed, but only abnormal results are displayed) Labs Reviewed - No data to display  Radiology DG Elbow Complete Left Result Date: 06/15/2024 CLINICAL DATA:  Postreduction left elbow EXAM: LEFT ELBOW - COMPLETE 3+ VIEW COMPARISON:   06/15/2024 FINDINGS: Interval reduction of previous left elbow dislocation with anatomic alignment demonstrated. There is a displaced fracture fragment projecting over the medial joint space likely arising from the coronoid process. No significant effusion. Soft tissues are unremarkable. IMPRESSION: Anatomic position of the left elbow postreduction. Fracture fragment over the medial joint space likely rising from the coronoid process. Electronically Signed   By: Elsie Gravely M.D.   On: 06/15/2024 19:01   DG Elbow 2 Views Left Result Date: 06/15/2024 CLINICAL DATA:  Left arm injury due to a fall. EXAM: LEFT ELBOW - 2 VIEW COMPARISON:  None Available. FINDINGS: A single lateral view of the left elbow is obtained. There is complete posterior dislocation of the radius and ulna with respect to the distal humerus. No fractures are demonstrated on single view. IMPRESSION: Limited single view of the left elbow demonstrates complete posterior dislocation of the left elbow. Electronically Signed   By: Elsie Gravely M.D.   On: 06/15/2024 18:07    Pertinent labs & imaging results that were available during my care of the patient were reviewed by me and considered in my medical decision making (see MDM for details).  Medications Ordered in ED Medications  propofol  (DIPRIVAN ) 10 mg/mL bolus/IV push 68 mg (68 mg Intravenous Given by Other 06/15/24 1837)  lactated ringers  bolus 1,000 mL (0 mLs Intravenous Stopped 06/15/24 1941)  propofol  (DIPRIVAN ) 10 mg/mL bolus/IV push (102 mg Intravenous Given 06/15/24 1845)                                                                                                                                     Procedures .Sedation  Date/Time: 06/16/2024 1:27 PM  Performed by: Albertina Dixon, MD Authorized by: Albertina Dixon, MD   Consent:    Consent obtained:  Verbal   Consent given by:  Patient   Risks discussed:  Allergic reaction, dysrhythmia, inadequate sedation,  nausea, prolonged hypoxia resulting in organ damage, prolonged sedation necessitating reversal, respiratory compromise necessitating ventilatory assistance and intubation and vomiting   Alternatives discussed:  Analgesia without sedation, anxiolysis and regional anesthesia Universal protocol:    Procedure explained and questions answered to patient or proxy's satisfaction: yes     Relevant documents present and verified: yes     Test results available: yes     Imaging studies available: yes     Required blood products, implants, devices, and special equipment available: yes     Site/side marked: yes     Immediately prior to procedure, a time out was called: yes     Patient identity confirmed:  Verbally with patient Indications:    Procedure necessitating sedation performed by:  Physician performing sedation Pre-sedation assessment:    Time since last food or drink:  1200   ASA classification: class  1 - normal, healthy patient     Mouth opening:  3 or more finger widths   Thyromental distance:  4 finger widths   Mallampati score:  I - soft palate, uvula, fauces, pillars visible   Neck mobility: normal     Pre-sedation assessments completed and reviewed: airway patency, cardiovascular function, hydration status, mental status, nausea/vomiting, pain level, respiratory function and temperature   A pre-sedation assessment was completed prior to the start of the procedure Immediate pre-procedure details:    Reassessment: Patient reassessed immediately prior to procedure     Reviewed: vital signs, relevant labs/tests and NPO status     Verified: bag valve mask available, emergency equipment available, intubation equipment available, IV patency confirmed, oxygen available and suction available   Procedure details (see MAR for exact dosages):    Preoxygenation:  Nasal cannula   Sedation:  Propofol    Intended level of sedation: deep   Intra-procedure monitoring:  Blood pressure monitoring,  cardiac monitor, continuous pulse oximetry, frequent LOC assessments, frequent vital sign checks and continuous capnometry   Intra-procedure events: none     Total Provider sedation time (minutes):  15 Post-procedure details:   A post-sedation assessment was completed following the completion of the procedure.   Attendance: Constant attendance by certified staff until patient recovered     Recovery: Patient returned to pre-procedure baseline     Post-sedation assessments completed and reviewed: airway patency, cardiovascular function, hydration status, mental status, nausea/vomiting, pain level, respiratory function and temperature     Patient is stable for discharge or admission: yes     Procedure completion:  Tolerated well, no immediate complications .Ortho Injury Treatment  Date/Time: 06/16/2024 1:28 PM  Performed by: Albertina Dixon, MD Authorized by: Albertina Dixon, MD   Consent:    Consent obtained:  Verbal   Consent given by:  Patient   Risks discussed:  Fracture, nerve damage, restricted joint movement and vascular damage   Alternatives discussed:  No treatment and alternative treatmentInjury location: elbow Location details: left elbow Injury type: dislocation Dislocation type: posterior Pre-procedure distal perfusion: normal Pre-procedure neurological function: normal Pre-procedure range of motion: reduced  Anesthesia: Local anesthesia used: no  Patient sedated: Yes. Refer to sedation procedure documentation for details of sedation. Manipulation performed: yes Reduction method: direct traction Reduction successful: yes X-ray confirmed reduction: yes Immobilization: splint Splint type: long arm Splint Applied by: Ortho Tech Supplies used: Ortho-Glass Post-procedure distal perfusion: normal Post-procedure neurological function: normal Post-procedure range of motion: unchanged     (including critical care time)  Medical Decision Making / ED Course   This  patient presents to the ED for concern of fall, elbow injury, this involves an extensive number of treatment options, and is a complaint that carries with it a high risk of complications and morbidity.  The differential diagnosis includes fracture, dislocation, hematoma, contusion, laceration  MDM: Patient seen emergency room for evaluation of a fall with an elbow injury.  Physical exam with an obvious deformity to the left elbow and subjective paresthesias to the distal forearm and fingers but she does have full range of motion at the wrist and good strength with hand squeeze.  Initial x-ray imaging showing complete posterior elbow dislocation.  Propofol  sedation performed and the elbow relocated.  Patient placed in a splint and in the sling.  Spoke with the orthopedic surgeon on-call Dr. Edna who will help get her arranged with outpatient follow-up.  At this time she does not meet inpatient criteria for admission and will  be discharged with outpatient follow-up.  Of note, patient did require a significant amount of propofol  to obtain appropriate conscious sedation.  She was 68 kg and required 170 of propofol .  Thus in the future, any additional sedation should be aware that she will require additional medication.   Additional history obtained: -Additional history obtained from parents -External records from outside source obtained and reviewed including: Chart review including previous notes, labs, imaging, consultation notes    Imaging Studies ordered: I ordered imaging studies including elbow x-ray I independently visualized and interpreted imaging. I agree with the radiologist interpretation   Medicines ordered and prescription drug management: Meds ordered this encounter  Medications   DISCONTD: morphine  (PF) 4 MG/ML injection 4 mg   propofol  (DIPRIVAN ) 10 mg/mL bolus/IV push 68 mg   DISCONTD: PHENYLephrine  80 mcg/ml in normal saline Adult IV Push Syringe (For Blood Pressure  Support)   lactated ringers  bolus 1,000 mL   propofol  (DIPRIVAN ) 10 mg/mL bolus/IV push   naproxen  (NAPROSYN ) 375 MG tablet    Sig: Take 1 tablet (375 mg total) by mouth 2 (two) times daily.    Dispense:  20 tablet    Refill:  0    -I have reviewed the patients home medicines and have made adjustments as needed  Critical interventions none  Consultations Obtained: I requested consultation with the orthopedic surgeon on-call Dr. Edna,  and discussed lab and imaging findings as well as pertinent plan - they recommend: Outpatient follow-up   Cardiac Monitoring: The patient was maintained on a cardiac monitor.  I personally viewed and interpreted the cardiac monitored which showed an underlying rhythm of: NSR  Social Determinants of Health:  Factors impacting patients care include: nonre   Reevaluation: After the interventions noted above, I reevaluated the patient and found that they have :improved  Co morbidities that complicate the patient evaluation  Past Medical History:  Diagnosis Date   Menstrual flow excessive    Mild intellectual disability    Premature birth    18 weeks   Schizoaffective disorder, bipolar type (HCC)    Seizure disorder (HCC) 04/2020   (02-16-2023  per pt father last seizure 08/ 2023) //  neurologist--- dr a. gregg;   first seizure 05-14-2020 ED visit in epic;  intractable seizure      Dispostion: I considered admission for this patient, but at this time she does not meet inpatient criteria for admission and will be discharged outpatient follow-up     Final Clinical Impression(s) / ED Diagnoses Final diagnoses:  Dislocation of left elbow, initial encounter     @PCDICTATION @    Albertina Dixon, MD 06/16/24 1332

## 2024-06-23 ENCOUNTER — Other Ambulatory Visit: Payer: Self-pay | Admitting: Neurology

## 2024-06-28 ENCOUNTER — Other Ambulatory Visit: Payer: Self-pay

## 2024-06-28 MED ORDER — LAMOTRIGINE 100 MG PO TABS: 250.0000 mg | ORAL_TABLET | Freq: Two times a day (BID) | ORAL | 0 refills | Status: DC

## 2024-06-28 NOTE — Progress Notes (Signed)
Med refilled per fax request

## 2024-07-23 ENCOUNTER — Other Ambulatory Visit: Payer: Self-pay | Admitting: Neurology

## 2024-08-15 NOTE — Progress Notes (Unsigned)
 Reason for visit: Intractable seizures  Ariel Clark is an 23 y.o. female  Update 08/16/24 SS: Here with her mom, labs last visit Keppra  level 11.9, Lamictal  6.0, vitamin D  48, normal BMP.  Remains on Lamictal  250 mg twice a day, Keppra  750 mg twice a day. Goes to a day program.  No seizures.  Doing well.  INTERVAL HISTORY 08/18/2023: Ariel Clark presents today for follow-up, she is accompanied by her mother and caregiver.  Last visit was in September last year and since then she has been doing well, she is currently on the lamotrigine  250 mg twice daily and Keppra  750 mg twice daily.  She denies any seizure or seizure-like activity and denies any side effect of the medications.  Overall she is doing well no other complaint no other questions.  INTERVAL HISTORY 08/05/2022:  Ariel Clark presents today for follow-up, she is accompanied by her mother.  Last visit was in March and at that time she was complaining of leg swelling, dizziness and tremor and I have decreased the Lamictal  from 250 to 200 mg BID.  Unfortunately she had a subsequent seizure and lamotrigine  has been increased back to 250 mg BID.  Since then she has been doing well on Lamictal  250 twice daily and Keppra  750 twice daily.  She did follow-up with dermatology regarding the leg swelling and it was due to the amantadine  that she was taking.  Amantadine  has been discontinued and patient is doing well, leg swelling resolved and the skin discoloration also improved.  Mother stated that now she is walking without any fear of fall.   INTERVAL HISTORY 01/28/2022:  Patient presents today for follow-up, she is accompanied by her parents.  Since last visit in December no additional seizures.  She is tolerating the medication well, currently she is on levetiracetam  750 mg twice daily and lamotrigine  250 mg twice daily.  Parents reported that usually at the end of the day she does complain of generalized weakness, feeling tired and not wanting to walk.  There  is also report of patient feeling unsteady but no seizures.      INTERVAL HISTORY 10/29/2021 Patient present today for follow-up with parents, last visit was in August, at that time plan was to add levetiracetam  to her regimen.  Currently she is taking levetiracetam  750 mg twice daily, denies any side effect from the medication, and she is also taking lamotrigine  250 mg twice daily.  Family report that her seizure frequency has decreased, her last seizure was in the beginning of November when she used to have 2 seizures per month.  No new concern.    Her seizure history started last year, she has tried Depakote , lamotrigine  and levetiracetam .  Had side effects from Depakote  of dizziness and imbalance.     History of present illness:  Ariel Clark is a 23 year old right-handed white female with a history of premature birth, intellectual deficits, and intractable seizures.  The patient is having seizures about once every 2 to 3 weeks or so.  Some of the seizures will come out of sleep but most are during the daytime.  The mother indicates that she oftentimes will have a sensation of anxiety prior to the onset of the seizure.  Some of the seizures can be quite prolonged.  She may have incontinence of the bladder, she keeps her eyes open during the seizure event.  She will have bruising at times but no serious injury has occurred so far.  The patient is on  lamotrigine  currently, she did not tolerate the Depakote  well because of increased tremors.  The patient has undergone a prolonged EEG overnight that was normal.  Routine EEG has also been normal in the past.  She returns to the office today for an evaluation.  She recently was increased on the Lamictal  last week to a 250 mg twice daily dose, she tolerates this well.   Past Medical History:  Diagnosis Date   Menstrual flow excessive    Mild intellectual disability    Premature birth    49 weeks   Schizoaffective disorder, bipolar type (HCC)     Seizure disorder (HCC) 04/2020   (02-16-2023  per pt father last seizure 08/ 2023) //  neurologist--- dr a. gregg;   first seizure 05-14-2020 ED visit in epic;  intractable seizure    Past Surgical History:  Procedure Laterality Date   ADENOIDECTOMY AND MYRINGOTOMY WITH TUBE PLACEMENT Bilateral 2008   @n  [facility]   COLONOSCOPY WITH ESOPHAGOGASTRODUODENOSCOPY (EGD)  07/24/2020   @ [facility] w/ anesthesia   EYE EXAMINATION UNDER ANESTHESIA W/ RETINAL CRYOTHERAPY AND RETINAL LASER  2002   HYMENOTOMY N/A 02/25/2023   Procedure: HYMENOTOMY;  Surgeon: Darcel Pool, MD;  Location: [facility];  Service: Gynecology;  Laterality: N/A;  IV SEDATION   INTRAUTERINE DEVICE (IUD) INSERTION N/A 02/25/2023   Procedure: INTRAUTERINE DEVICE (IUD) INSERTION;  Surgeon: Darcel Pool, MD;  Location: [facility];  Service: Gynecology;  Laterality: N/A;  IV SEDATION   TONSILLECTOMY Bilateral 11/2007   @ [facility];   w/ tympanostomy tube placement bilateral   WISDOM TOOTH EXTRACTION  2022   general anesthesia in dental surgeon office    Family History  Problem Relation Age of Onset   Cancer Maternal Grandmother 70   Other Paternal Grandmother 34       Murdered    Social history:  reports that she has never smoked. She has never used smokeless tobacco. She reports that she does not drink alcohol and does not use drugs.    Allergies  Allergen Reactions   Amantadines Swelling    Numbness, swelling, leg discoloration    Medications:   Current Outpatient Medications:    ARISTADA 662 MG/2.4ML prefilled syringe, Inject 662 mg into the muscle once., Disp: , Rfl:    busPIRone  (BUSPAR ) 15 MG tablet, Take 15 mg by mouth 2 (two) times daily. 0800  and 2000, Disp: , Rfl:    Cholecalciferol (VITAMIN D3) 125 MCG (5000 UT) capsule, Take 5,000 Units by mouth at bedtime., Disp: , Rfl:    docusate sodium  (COLACE) 100 MG capsule, Take 100 mg by mouth daily. 1 tab in the morning., Disp: , Rfl:     ibuprofen  (ADVIL ) 600 MG tablet, Take 1 tablet (600 mg total) by mouth every 6 (six) hours as needed (Patient may take 1st dose today at 2:30 pm)., Disp: 30 tablet, Rfl: 0   lamoTRIgine  (LAMICTAL ) 100 MG tablet, Take 2.5 tablets (250 mg total) by mouth 2 (two) times daily., Disp: 450 tablet, Rfl: 0   levETIRAcetam  (KEPPRA ) 750 MG tablet, Take 1 tablet by mouth twice daily., Disp: 180 tablet, Rfl: 1   naproxen  (NAPROSYN ) 375 MG tablet, Take 1 tablet (375 mg total) by mouth 2 (two) times daily., Disp: 20 tablet, Rfl: 0   QUEtiapine  (SEROQUEL ) 50 MG tablet, Take 100 mg by mouth at bedtime., Disp: , Rfl:    ROS:  Out of a complete 14 system review of symptoms, the patient complains only of  the following symptoms, and all other reviewed systems are negative.  Seizures Anxiety  There were no vitals taken for this visit.  Physical Exam  General: The patient is alert and cooperative at the time of the examination.  Skin: No significant peripheral edema is noted.   Neurologic Exam  Mental status: The patient is alert and oriented x 3 at the time of the examination. The patient has apparent normal recent and remote memory, with an apparently normal attention span and concentration ability. Naming, repetition intact.  Cranial nerves: Facial symmetry is present. Speech is normal, no aphasia or dysarthria is noted. Extraocular movements are full. Visual fields are full. Right pupil is a little larger than the left.   Motor: The patient has good strength in all 4 extremities.  Sensory examination: Soft touch sensation is symmetric on the face, arms, and legs.  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  Coordination: The patient has good finger-nose-finger and heel-to-shin bilaterally.  Gait and station: The patient has a normal gait.   Reflexes: Deep tendon reflexes are symmetric.  Overnight EEG 05/06/21:  IMPRESSION: This study is within normal limits. No seizures or  epileptiform discharges were seen throughout the recording.  Assessment/Plan:  1.  Intractable seizures  - Doing very well, no recurrent seizure, last seizure November 2022 - Continue lamotrigine  250 mg twice a day - Continue levetiracetam  750 mg twice a day - Check routine labs today - Next Steps: breakthrough seizures, will consider Cenobamate, vs. Clobazam vs. VNS therapy. - Follow-up in 1 year virtually, call for seizure activity  Lauraine Born, SCHARLENE, DNP  [facility] [address] [place], [place] [address] [phone]

## 2024-08-16 ENCOUNTER — Ambulatory Visit (INDEPENDENT_AMBULATORY_CARE_PROVIDER_SITE_OTHER): Payer: Medicare Other | Admitting: Neurology

## 2024-08-16 ENCOUNTER — Encounter: Payer: Self-pay | Admitting: Neurology

## 2024-08-16 VITALS — BP 120/76 | HR 74 | Ht 60.0 in | Wt 173.0 lb

## 2024-08-16 DIAGNOSIS — G40909 Epilepsy, unspecified, not intractable, without status epilepticus: Secondary | ICD-10-CM

## 2024-08-16 MED ORDER — LAMOTRIGINE 100 MG PO TABS: 250.0000 mg | ORAL_TABLET | Freq: Two times a day (BID) | ORAL | 4 refills | Status: AC

## 2024-08-16 MED ORDER — LEVETIRACETAM 750 MG PO TABS: 750.0000 mg | ORAL_TABLET | Freq: Two times a day (BID) | ORAL | 4 refills | Status: AC

## 2024-08-16 NOTE — Patient Instructions (Signed)
 Great to see you today! Continue current seizure medications Check labs Call for seizure activity Follow-up in 1 year.  Thanks!!

## 2024-08-17 LAB — CBC WITH DIFFERENTIAL/PLATELET
Basophils Absolute: 0.1 x10E3/uL (ref 0.0–0.2)
Basos: 1 %
EOS (ABSOLUTE): 0.1 x10E3/uL (ref 0.0–0.4)
Eos: 1 %
Hematocrit: 43.5 % (ref 34.0–46.6)
Hemoglobin: 14.3 g/dL (ref 11.1–15.9)
Immature Grans (Abs): 0 x10E3/uL (ref 0.0–0.1)
Immature Granulocytes: 0 %
Lymphocytes Absolute: 1.9 x10E3/uL (ref 0.7–3.1)
Lymphs: 30 %
MCH: 28.5 pg (ref 26.6–33.0)
MCHC: 32.9 g/dL (ref 31.5–35.7)
MCV: 87 fL (ref 79–97)
Monocytes Absolute: 0.4 x10E3/uL (ref 0.1–0.9)
Monocytes: 7 %
Neutrophils Absolute: 3.8 x10E3/uL (ref 1.4–7.0)
Neutrophils: 61 %
Platelets: 282 x10E3/uL (ref 150–450)
RBC: 5.02 x10E6/uL (ref 3.77–5.28)
RDW: 13.6 % (ref 11.7–15.4)
WBC: 6.4 x10E3/uL (ref 3.4–10.8)

## 2024-08-17 LAB — COMPREHENSIVE METABOLIC PANEL WITH GFR
ALT: 18 IU/L (ref 0–32)
AST: 18 IU/L (ref 0–40)
Albumin: 4.4 g/dL (ref 4.0–5.0)
Alkaline Phosphatase: 157 IU/L — ABNORMAL HIGH (ref 41–116)
BUN/Creatinine Ratio: 13 (ref 9–23)
BUN: 12 mg/dL (ref 6–20)
Bilirubin Total: 0.3 mg/dL (ref 0.0–1.2)
CO2: 19 mmol/L — ABNORMAL LOW (ref 20–29)
Calcium: 9.2 mg/dL (ref 8.7–10.2)
Chloride: 102 mmol/L (ref 96–106)
Creatinine, Ser: 0.9 mg/dL (ref 0.57–1.00)
Globulin, Total: 2.6 g/dL (ref 1.5–4.5)
Glucose: 100 mg/dL — ABNORMAL HIGH (ref 70–99)
Potassium: 4.3 mmol/L (ref 3.5–5.2)
Sodium: 139 mmol/L (ref 134–144)
Total Protein: 7 g/dL (ref 6.0–8.5)
eGFR: 92 mL/min/1.73 (ref >59)

## 2024-08-17 LAB — LEVETIRACETAM LEVEL: Levetiracetam Lvl: 28.6 ug/mL (ref 10.0–40.0)

## 2024-08-17 LAB — LAMOTRIGINE LEVEL: Lamotrigine Lvl: 9.9 ug/mL (ref 2.0–20.0)

## 2024-08-21 ENCOUNTER — Ambulatory Visit: Payer: Self-pay | Admitting: Neurology

## 2025-08-29 ENCOUNTER — Telehealth: Admitting: Neurology
